# Patient Record
Sex: Male | Born: 1995 | Race: Black or African American | Hispanic: No | Marital: Single | Smoking: Current some day smoker
Health system: Southern US, Community
[De-identification: ages and names within clinical notes are randomized; demographics above are authoritative.]

## PROBLEM LIST (undated history)

## (undated) ENCOUNTER — Ambulatory Visit (HOSPITAL_COMMUNITY): Admission: EM | Payer: Medicaid Other | Source: Home / Self Care

---
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
## 2014-03-12 ENCOUNTER — Emergency Department (HOSPITAL_COMMUNITY): Payer: Medicaid Other

## 2014-03-12 ENCOUNTER — Emergency Department (HOSPITAL_COMMUNITY)
Admission: EM | Admit: 2014-03-12 | Discharge: 2014-03-12 | Disposition: A | Discharge status: Home / Self Care | Payer: Medicaid Other | Attending: Emergency Medicine | Admitting: Emergency Medicine

## 2014-03-12 ENCOUNTER — Encounter (HOSPITAL_COMMUNITY): Payer: Self-pay | Admitting: Emergency Medicine

## 2014-03-12 DIAGNOSIS — Z79899 Other long term (current) drug therapy: Secondary | ICD-10-CM | POA: Diagnosis not present

## 2014-03-12 DIAGNOSIS — S139XXA Sprain of joints and ligaments of unspecified parts of neck, initial encounter: Secondary | ICD-10-CM | POA: Diagnosis not present

## 2014-03-12 DIAGNOSIS — F17200 Nicotine dependence, unspecified, uncomplicated: Secondary | ICD-10-CM | POA: Insufficient documentation

## 2014-03-12 DIAGNOSIS — Y9389 Activity, other specified: Secondary | ICD-10-CM | POA: Diagnosis not present

## 2014-03-12 DIAGNOSIS — Z792 Long term (current) use of antibiotics: Secondary | ICD-10-CM | POA: Insufficient documentation

## 2014-03-12 DIAGNOSIS — S20219A Contusion of unspecified front wall of thorax, initial encounter: Secondary | ICD-10-CM | POA: Diagnosis not present

## 2014-03-12 DIAGNOSIS — S0993XA Unspecified injury of face, initial encounter: Secondary | ICD-10-CM | POA: Diagnosis present

## 2014-03-12 DIAGNOSIS — S199XXA Unspecified injury of neck, initial encounter: Secondary | ICD-10-CM | POA: Diagnosis present

## 2014-03-12 DIAGNOSIS — R079 Chest pain, unspecified: Secondary | ICD-10-CM | POA: Insufficient documentation

## 2014-03-12 DIAGNOSIS — Y92410 Unspecified street and highway as the place of occurrence of the external cause: Secondary | ICD-10-CM | POA: Diagnosis not present

## 2014-03-12 DIAGNOSIS — S161XXA Strain of muscle, fascia and tendon at neck level, initial encounter: Secondary | ICD-10-CM

## 2014-03-12 MED ORDER — IBUPROFEN 600 MG PO TABS: 600.0000 mg | ORAL_TABLET | Freq: Four times a day (QID) | ORAL | Status: DC | PRN

## 2014-03-12 MED ORDER — IOHEXOL 300 MG/ML  SOLN
80.0000 mL | Freq: Once | INTRAMUSCULAR | Status: AC | PRN
  Administered 2014-03-12: 80 mL via INTRAVENOUS

## 2014-03-12 MED ORDER — DIAZEPAM 5 MG PO TABS: 5.0000 mg | ORAL_TABLET | Freq: Four times a day (QID) | ORAL | Status: DC | PRN

## 2014-03-12 MED ORDER — OXYCODONE-ACETAMINOPHEN 5-325 MG PO TABS
1.0000 | ORAL_TABLET | Freq: Once | ORAL | Status: AC
  Administered 2014-03-12: 1 via ORAL
  Filled 2014-03-12: qty 1

## 2014-03-12 MED ORDER — OXYCODONE-ACETAMINOPHEN 5-325 MG PO TABS: 1.0000 | ORAL_TABLET | Freq: Four times a day (QID) | ORAL | Status: DC | PRN
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
## 2014-03-12 NOTE — ED Notes (Signed)
Pt reports "not trying to breath hard because it hurts." Pt current HR 30s-50s. Strip from Diplomatic Services operational officersecretary monitor given to MD.

## 2014-04-14 ENCOUNTER — Ambulatory Visit: Payer: Medicaid Other | Admitting: Family Medicine

## 2014-07-28 ENCOUNTER — Emergency Department (HOSPITAL_COMMUNITY): Payer: Medicaid Other

## 2014-07-28 ENCOUNTER — Emergency Department (HOSPITAL_COMMUNITY)
Admission: EM | Admit: 2014-07-28 | Discharge: 2014-07-28 | Disposition: A | Discharge status: Home / Self Care | Payer: Medicaid Other | Attending: Emergency Medicine | Admitting: Emergency Medicine

## 2014-07-28 ENCOUNTER — Encounter (HOSPITAL_COMMUNITY): Payer: Self-pay | Admitting: Emergency Medicine

## 2014-07-28 DIAGNOSIS — S61509A Unspecified open wound of unspecified wrist, initial encounter: Secondary | ICD-10-CM | POA: Insufficient documentation

## 2014-07-28 DIAGNOSIS — W268XXA Contact with other sharp object(s), not elsewhere classified, initial encounter: Secondary | ICD-10-CM | POA: Insufficient documentation

## 2014-07-28 DIAGNOSIS — Y9389 Activity, other specified: Secondary | ICD-10-CM | POA: Diagnosis not present

## 2014-07-28 DIAGNOSIS — Z79899 Other long term (current) drug therapy: Secondary | ICD-10-CM | POA: Diagnosis not present

## 2014-07-28 DIAGNOSIS — Y9289 Other specified places as the place of occurrence of the external cause: Secondary | ICD-10-CM | POA: Diagnosis not present

## 2014-07-28 DIAGNOSIS — F17200 Nicotine dependence, unspecified, uncomplicated: Secondary | ICD-10-CM | POA: Diagnosis not present

## 2014-07-28 DIAGNOSIS — S66909A Unspecified injury of unspecified muscle, fascia and tendon at wrist and hand level, unspecified hand, initial encounter: Secondary | ICD-10-CM | POA: Diagnosis not present

## 2014-07-28 DIAGNOSIS — S61512A Laceration without foreign body of left wrist, initial encounter: Secondary | ICD-10-CM

## 2014-07-28 MED ORDER — OXYCODONE-ACETAMINOPHEN 5-325 MG PO TABS: 1.0000 | ORAL_TABLET | Freq: Four times a day (QID) | ORAL | Status: DC | PRN

## 2014-07-28 MED ORDER — CEPHALEXIN 500 MG PO CAPS: 500.0000 mg | ORAL_CAPSULE | Freq: Three times a day (TID) | ORAL | Status: DC

## 2014-07-28 MED ORDER — SODIUM BICARBONATE 4 % IV SOLN
5.0000 mL | Freq: Once | INTRAVENOUS | Status: AC
  Administered 2014-07-28: 5 mL via SUBCUTANEOUS
  Filled 2014-07-28: qty 5

## 2014-07-28 MED ORDER — LIDOCAINE HCL 1 % IJ SOLN
30.0000 mL | Freq: Once | INTRAMUSCULAR | Status: AC
  Administered 2014-07-28: 30 mL
  Filled 2014-07-28: qty 40

## 2014-07-28 NOTE — Discharge Instructions (Signed)
Sutured Wound Care °Sutures are stitches that can be used to close wounds. Wound care helps prevent pain and infection.  °HOME CARE INSTRUCTIONS  °· Rest and elevate the injured area until all the pain and swelling are gone. °· Only take over-the-counter or prescription medicines for pain, discomfort, or fever as directed by your caregiver. °· After 48 hours, gently wash the area with mild soap and water once a day, or as directed. Rinse off the soap. Pat the area dry with a clean towel. Do not rub the wound. This may cause bleeding. °· Follow your caregiver's instructions for how often to change the bandage (dressing). Stop using a dressing after 2 days or after the wound stops draining. °· If the dressing sticks, moisten it with soapy water and gently remove it. °· Apply ointment on the wound as directed. °· Avoid stretching a sutured wound. °· Drink enough fluids to keep your urine clear or pale yellow. °· Follow up with your caregiver for suture removal as directed. °· Use sunscreen on your wound for the next 3 to 6 months so the scar will not darken. °SEEK IMMEDIATE MEDICAL CARE IF:  °· Your wound becomes red, swollen, hot, or tender. °· You have increasing pain in the wound. °· You have a red streak that extends from the wound. °· There is pus coming from the wound. °· You have a fever. °· You have shaking chills. °· There is a bad smell coming from the wound. °· You have persistent bleeding from the wound. °MAKE SURE YOU:  °· Understand these instructions. °· Will watch your condition. °· Will get help right away if you are not doing well or get worse. °Document Released: 12/06/2004 Document Revised: 01/21/2012 Document Reviewed: 03/04/2011 °ExitCare® Patient Information ©2015 [employer], [employer]. This information is not intended to replace advice given to you by your health care provider. Make sure you discuss any questions you have with your health care provider. ° °

## 2014-07-28 NOTE — ED Notes (Signed)
Suture cart and supplies to bedside

## 2014-07-28 NOTE — ED Notes (Signed)
Pt to xray

## 2014-07-28 NOTE — ED Provider Notes (Signed)
CSN: [id number]     Arrival date & time 07/28/14  1738 History   First MD Initiated Contact with Patient 07/28/14 1744     Chief Complaint  Patient presents with  . Extremity Laceration     (Consider location/radiation/quality/duration/timing/severity/associated sxs/prior Treatment) The history is provided by the patient.   patient was laceration to left wrist when he put his hand through a glass door, trying to hold it open. No other injury. Patient believes his tetanus is up-to-date. No numbness or weakness. No other medical history  History reviewed. No pertinent past medical history. History reviewed. No pertinent past surgical history. History reviewed. No pertinent family history. History  Substance Use Topics  . Smoking status: Current Some Day Smoker    Types: Cigars  . Smokeless tobacco: Never Used  . Alcohol Use: No    Review of Systems  Musculoskeletal: Negative for back pain.  Skin: Positive for wound.  Neurological: Negative for weakness and numbness.      Allergies  Review of patient's allergies indicates no known allergies.  Home Medications   Prior to Admission medications   Medication Sig Start Date End Date Taking? Authorizing Provider  amphetamine-dextroamphetamine (ADDERALL XR) 20 MG 24 hr capsule Take 1 capsule (20 mg total) by mouth every morning. 02/10/13   Shelly Rubenstein, MD  cephALEXin (KEFLEX) 500 MG capsule Take 1 capsule (500 mg total) by mouth 3 (three) times daily. 07/28/14   Juliet Rude. Golden Emile, MD  diazepam (VALIUM) 5 MG tablet Take 1 tablet (5 mg total) by mouth every 6 (six) hours as needed for muscle spasms. 03/12/14   Juliet Rude. Dyshaun Bonzo, MD  ibuprofen (ADVIL,MOTRIN) 200 MG tablet Take 200 mg by mouth every 6 (six) hours as needed for mild pain.    Historical Provider, MD  ibuprofen (ADVIL,MOTRIN) 600 MG tablet Take 1 tablet (600 mg total) by mouth every 6 (six) hours as needed. 03/12/14   Juliet Rude. Darrin Apodaca, MD  oxyCODONE-acetaminophen  (PERCOCET/ROXICET) 5-325 MG per tablet Take 1-2 tablets by mouth every 6 (six) hours as needed for severe pain. 03/12/14   Juliet Rude. Lizandra Zakrzewski, MD  oxyCODONE-acetaminophen (PERCOCET/ROXICET) 5-325 MG per tablet Take 1-2 tablets by mouth every 6 (six) hours as needed for severe pain. 07/28/14   Juliet Rude. Shuntia Exton, MD   BP 129/78  Pulse 60  Temp(Src) 98 F (36.7 C) (Oral)  Resp 18  SpO2 99% Physical Exam  Constitutional: He appears well-developed and well-nourished.  Cardiovascular: Normal rate.   Musculoskeletal: He exhibits tenderness.  Lacerations to ulnar aspect of left wrist. Flexion and extension intact at the MCP PIP and DIP joints. Flexion appears at wrist, but possibly some weakness on the ulnar aspect. There is visualized tendon that is likely the flexor carpi ulnaris appears to be transected. No pulsatile bleeding. Sensation intact over ulnar distribution of hand. Extension appears intact throughout. Wound is rather jagged with several connecting lacerations, apparently around 9 cm laceration total.    ED Course  LACERATION REPAIR Date/Time: 07/28/2014 6:45 PM Performed by: Benjiman Core R. Authorized by: Billee Cashing Consent: Verbal consent obtained. written consent not obtained. Risks and benefits: risks, benefits and alternatives were discussed Consent given by: patient and parent Patient understanding: patient states understanding of the procedure being performed Imaging studies: imaging studies available Required items: required blood products, implants, devices, and special equipment available Patient identity confirmed: verbally with patient and arm band Time out: Immediately prior to procedure a "time out" was called to verify the  correct patient, procedure, equipment, support staff and site/side marked as required. Body area: upper extremity Location details: left wrist Laceration length: 9 cm Tendon involvement: complex Nerve involvement: none Vascular  damage: no Anesthesia: local infiltration Local anesthetic: lidocaine 1% without epinephrine Anesthetic total: 5 ml Patient sedated: no Preparation: Patient was prepped and draped in the usual sterile fashion. Irrigation method: syringe Amount of cleaning: standard Debridement: none Degree of undermining: none Wound skin closure material used: 5-0 vicryl rapide. Number of sutures: Multiple sutures combination of running horizontal mattress and simple interrupted, Technique: complex Approximation: close Approximation difficulty: complex Dressing: 4x4 sterile gauze Patient tolerance: Patient tolerated the procedure well with no immediate complications.   (including critical care time) Labs Review Labs Reviewed - No data to display  Imaging Review Dg Wrist Complete Left  07/28/2014   CLINICAL DATA:  Laceration at ulnar aspect of the wrist. Patient hand slipped and when through a glass door pane.  EXAM: LEFT WRIST - COMPLETE 3+ VIEW  COMPARISON:  None.  FINDINGS: No acute fracture or malalignment. No retained radiopaque foreign body. Soft tissue irregularity along the ulnar aspect of the wrist consistent with laceration.  IMPRESSION: Soft tissue laceration at the ulnar aspect of the wrist without evidence of underlying osseous involvement or retained radiopaque foreign body.   Electronically Signed   By: Malachy Moan M.D.   On: 07/28/2014 18:10     EKG Interpretation None      MDM   Final diagnoses:  Wrist laceration, left, initial encounter    Patient hand laceration has tendon involvement it is likely flexor carpi ulnaris. Discussed with hand surgery. Skin closed. Will see in the office in followup. Will give antibiotics.    Juliet Rude. Rubin Payor, MD 07/28/14 [phone]

## 2014-07-28 NOTE — ED Notes (Signed)
Pt states that he was trying to catch a glass door and it broke on his wrist. Severe lac noted to ulnar side of wrist. Pt able to move all fingers distal to injury.  Good pulses and cap refill.

## 2014-07-28 NOTE — ED Notes (Signed)
md at bedside  Pt alert and oriented x4. Respirations even and unlabored, bilateral symmetrical rise and fall of chest. Skin warm and dry. In no acute distress. Denies needs.   

## 2014-07-29 ENCOUNTER — Ambulatory Visit: Payer: Medicaid Other | Admitting: Family Medicine

## 2014-07-29 ENCOUNTER — Ambulatory Visit (INDEPENDENT_AMBULATORY_CARE_PROVIDER_SITE_OTHER): Payer: Medicaid Other | Admitting: Family Medicine

## 2014-07-29 VITALS — BP 123/70 | HR 58 | Temp 98.7°F | Wt 153.8 lb

## 2014-07-29 DIAGNOSIS — S61512A Laceration without foreign body of left wrist, initial encounter: Secondary | ICD-10-CM

## 2014-07-29 DIAGNOSIS — S61509A Unspecified open wound of unspecified wrist, initial encounter: Secondary | ICD-10-CM

## 2014-07-29 DIAGNOSIS — S61519A Laceration without foreign body of unspecified wrist, initial encounter: Secondary | ICD-10-CM | POA: Insufficient documentation

## 2014-07-29 NOTE — Assessment & Plan Note (Signed)
Repair looks good. It is intact with hemostasis. No signs of infection. Redressed wound. Applied Ace bandage over dressing to help with wrist stabilization. Patient to followup as directed.

## 2014-07-29 NOTE — Patient Instructions (Signed)
Keep wrist stabilized with Ace bandage. The certainly keep your followup appointment. Keep your dressing clean and dry.

## 2014-07-29 NOTE — Progress Notes (Signed)
   Subjective:    Patient ID: Erik Mason, male    DOB: June 12, 1996, 18 y.o.   MRN: [id number]  HPI Erik Mason is a 18 y.o. male presents to [facility]  Left hand trauma:  Patient was seen in the emergency room last night and had stitches applied to his left wrist. He states he had placed his hand through glass. He did receive his tetanus shot. Comes in to same-day clinic today for increased bleeding concerns. He is accompanied by his mother who states that they applied a dressing today and it is bleeding through a small fraction. His pain is well controlled.   Review of Systems Per history of present illness    Objective:   Physical Exam BP 123/70  Pulse 58  Temp(Src) 98.7 F (37.1 C) (Oral)  Wt 153 lb 12.8 oz (69.763 kg) Gen: Pleasant African American male, no acute distress, nontoxic in appearance. Ext: Left wrist with repair of laceration with what appears to be a Vicryl suture. Repair is intact with hemostasis. No erythema or swelling. No drainage.    Assessment & Plan:

## 2014-08-10 ENCOUNTER — Encounter: Payer: Self-pay | Admitting: Family Medicine

## 2014-08-10 ENCOUNTER — Ambulatory Visit (INDEPENDENT_AMBULATORY_CARE_PROVIDER_SITE_OTHER): Payer: Medicaid Other | Admitting: Family Medicine

## 2014-08-10 VITALS — BP 122/82 | HR 84 | Temp 98.3°F | Wt 152.0 lb

## 2014-08-10 DIAGNOSIS — S61512D Laceration without foreign body of left wrist, subsequent encounter: Secondary | ICD-10-CM

## 2014-08-10 DIAGNOSIS — Z5189 Encounter for other specified aftercare: Secondary | ICD-10-CM

## 2014-08-10 DIAGNOSIS — S61509A Unspecified open wound of unspecified wrist, initial encounter: Secondary | ICD-10-CM

## 2014-08-10 NOTE — Progress Notes (Signed)
   Subjective:    Patient ID: Erik Mason, male    DOB: 18-Nov-1995, 18 y.o.   MRN: [id number]  HPI: Pt presents to clinic for f/u of a hand wound, with stitches placed 12 days ago. He has had no bleeding or discharge and minimal tenderness in the area. He put his hand through glass and was seen in the ED and had stitches placed, then was seen in the office for concerns with bleeding, but Dr. Claiborne BillingsKuneff at that time felt the area looked okay, and recommended follow-up. Since then, he has had no other issues.  Of note, mother is concerned about after-care after stitches are removed, dealing with whether or not the area can get wet, whether he can do lifting, etc.  Review of Systems: As above.     Objective:   Physical Exam BP 122/82  Pulse 84  Temp(Src) 98.3 F (36.8 C) (Oral)  Wt 152 lb (68.947 kg) Gen: well-appearing young adult male in NAD Cardio: RRR, no murmur Pulm: normal WOB Ext: left wrist with stitches in place, no erythema / swelling, no bleeding / drainage MSK/Neuro: left hand with intact neurovascular status  ROM full passively to left hand, though increases pain significantly  Pt willing very slowly to actively completely extend and flex fingers  Strength testing not done due to cooperation, but no apparent deficit left hand vs right     Assessment & Plan:  The above HPI was obtained with the assistance of SUPERVALU INCStanley "Berric" Clifton, [email or web address]. The remainder of this note reflects my independent exam and assessment / plan. 18yo male with left wrist laceration healing well. See problem list note.

## 2014-08-10 NOTE — Patient Instructions (Addendum)
Thank you for coming in, today!  Your wound looks like it's healing well. Wash the area like any other area of skin. DO NOT scrub it. Watch for any brisk bleeding, re-opening of the wound, or redness, swelling, or drainage.  You can use vaseline or an over-the-counter antibiotic to keep the area clean. You can use a plain gauze dressing changed daily if you want while it continues to heal.  If you have any other issues, call or come back to see us as needed.  Please feel free to call with any questions or concerns at any time, at [phone]. --Dr. Derrill CenterStreet  Suture Removal, Care After Refer to this sheet in the next few weeks. These instructions provide you with information on caring for yourself after your procedure. Your health care provider may also give you more specific instructions. Your treatment has been planned according to current medical practices, but problems sometimes occur. Call your health care provider if you have any problems or questions after your procedure. WHAT TO EXPECT AFTER THE PROCEDURE After your stitches (sutures) are removed, it is typical to have the following:  Some discomfort and swelling in the wound area.  Slight redness in the area. HOME CARE INSTRUCTIONS   Change any bandages (dressings) at least once a day or as directed by your health care provider. If the bandage sticks, soak it off with warm, soapy water.  Apply cream or ointment only as directed by your health care provider. If using cream or ointment, wash the area with soap and water 2 times a day to remove all the cream or ointment. Rinse off the soap and pat the area dry with a clean towel.  Keep the wound area dry and clean. If the bandage becomes wet or dirty, or if it develops a bad smell, change it as soon as possible.  Continue to protect the wound from injury.  Use sunscreen when out in the sun. New scars become sunburned easily. SEEK MEDICAL CARE IF:  You have increasing redness,  swelling, or pain in the wound.  You see pus coming from the wound.  You have a fever.  You notice a bad smell coming from the wound or dressing.  Your wound breaks open (edges not staying together). Document Released: 07/24/2001 Document Revised: 08/19/2013 Document Reviewed: 06/10/2013 [facility]Exit[facility] Patient Information 2015 [place]Care, [place]. This information is not intended to replace advice given to you by your health care provider. Make sure you discuss any questions you have with your health care provider.

## 2014-08-10 NOTE — Assessment & Plan Note (Signed)
Left wrist laceration repair with stitches placed 12 days ago, healing well without dehiscence, bleeding, or drainage. Multiple sutures removed today (complex repair including horizontal mattress and simple interrupted), approximately 15 sutures, without difficulty. Wound after-care discussed. F/U PRN.  OF NOTE: On review of ED provider note, there was concern for flexor carpi ulnaris tendon involvement, but pt appears to have full ROM (note he was not completely cooperative with exam, due to discomfort) and no neurovascular compromise. If he continues to have issues, would strongly recommend referral to hand surgery (ED doctor discussed with hand surgeon and recommended hand surgery office f/u, but mother preferred coming here, from discussion). Note routed FYI to PCP.

## 2015-07-01 ENCOUNTER — Emergency Department (INDEPENDENT_AMBULATORY_CARE_PROVIDER_SITE_OTHER)
Admission: EM | Admit: 2015-07-01 | Discharge: 2015-07-01 | Disposition: A | Discharge status: Home / Self Care | Payer: Medicaid Other | Source: Home / Self Care | Attending: Emergency Medicine | Admitting: Emergency Medicine

## 2015-07-01 ENCOUNTER — Encounter (HOSPITAL_COMMUNITY): Payer: Self-pay

## 2015-07-01 DIAGNOSIS — B86 Scabies: Secondary | ICD-10-CM | POA: Diagnosis not present

## 2015-07-01 MED ORDER — PERMETHRIN 5 % EX CREA: TOPICAL_CREAM | CUTANEOUS | Status: DC

## 2015-07-01 MED ORDER — HYDROXYZINE HCL 25 MG PO TABS: 25.0000 mg | ORAL_TABLET | Freq: Four times a day (QID) | ORAL | Status: DC | PRN

## 2015-07-01 NOTE — ED Provider Notes (Signed)
CSN: [id number]     Arrival date & time 07/01/15  1403 History   First MD Initiated Contact with Patient 07/01/15 1432     Chief Complaint  Patient presents with  . Skin Problem   (Consider location/radiation/quality/duration/timing/severity/associated sxs/prior Treatment) HPI  He is a 19 year old man here for evaluation of skin rash. He states for the last 1-2 weeks he has had itchy bumps on his hands. He also states it is in the inner thigh area. It is very itchy. It is worse at night. No fevers. He lives with his mom, she does not have any symptoms. He has not tried anything.  History reviewed. No pertinent past medical history. History reviewed. No pertinent past surgical history. History reviewed. No pertinent family history. Social History  Substance Use Topics  . Smoking status: Current Some Day Smoker    Types: Cigars  . Smokeless tobacco: Never Used  . Alcohol Use: No    Review of Systems As in history of present illness Allergies  Review of patient's allergies indicates no known allergies.  Home Medications   Prior to Admission medications   Medication Sig Start Date End Date Taking? Authorizing Provider  amphetamine-dextroamphetamine (ADDERALL XR) 20 MG 24 hr capsule Take 1 capsule (20 mg total) by mouth every morning. 02/10/13   Shelly Rubenstein, MD  hydrOXYzine (ATARAX/VISTARIL) 25 MG tablet Take 1 tablet (25 mg total) by mouth every 6 (six) hours as needed for itching. 07/01/15   Charm Rings, MD  permethrin (ELIMITE) 5 % cream Apply from neck down; leave in place 8-12 hours then rinse off.  Repeat in 1 week if needed. 07/01/15   Charm Rings, MD   BP 111/75 mmHg  Pulse 81  Temp(Src) 97.9 F (36.6 C) (Oral)  Resp 16  SpO2 95% Physical Exam  Constitutional: He is oriented to person, place, and time. He appears well-developed and well-nourished. No distress.  Cardiovascular: Normal rate.   Pulmonary/Chest: Effort normal.  Neurological: He is alert and oriented to  person, place, and time.  Skin:  Multiple papules in the webspaces of both hands.    ED Course  Procedures (including critical care time) Labs Review Labs Reviewed - No data to display  Imaging Review No results found.   MDM   1. Scabies    Treat with permethrin and hydroxyzine. Handout given.    Charm Rings, MD 07/01/15 [phone]

## 2015-07-01 NOTE — Discharge Instructions (Signed)

## 2015-07-01 NOTE — ED Notes (Signed)
eval by MD only 

## 2015-07-13 ENCOUNTER — Encounter: Payer: Medicaid Other | Admitting: Family Medicine

## 2016-01-16 ENCOUNTER — Other Ambulatory Visit (HOSPITAL_COMMUNITY)
Admission: RE | Admit: 2016-01-16 | Discharge: 2016-01-16 | Disposition: A | Discharge status: Home / Self Care | Payer: Medicaid Other | Source: Ambulatory Visit | Attending: Family Medicine | Admitting: Family Medicine

## 2016-01-16 ENCOUNTER — Encounter (HOSPITAL_COMMUNITY): Payer: Self-pay | Admitting: *Deleted

## 2016-01-16 ENCOUNTER — Emergency Department (INDEPENDENT_AMBULATORY_CARE_PROVIDER_SITE_OTHER)
Admission: EM | Admit: 2016-01-16 | Discharge: 2016-01-16 | Disposition: A | Discharge status: Home / Self Care | Payer: Self-pay | Source: Home / Self Care | Attending: Family Medicine | Admitting: Family Medicine

## 2016-01-16 DIAGNOSIS — Z711 Person with feared health complaint in whom no diagnosis is made: Secondary | ICD-10-CM

## 2016-01-16 DIAGNOSIS — R369 Urethral discharge, unspecified: Secondary | ICD-10-CM

## 2016-01-16 DIAGNOSIS — Z113 Encounter for screening for infections with a predominantly sexual mode of transmission: Secondary | ICD-10-CM | POA: Insufficient documentation

## 2016-01-16 MED ORDER — CEFTRIAXONE SODIUM 1 G IJ SOLR
INTRAMUSCULAR | Status: AC
  Filled 2016-01-16: qty 10

## 2016-01-16 MED ORDER — AZITHROMYCIN 250 MG PO TABS
ORAL_TABLET | ORAL | Status: AC
  Filled 2016-01-16: qty 4

## 2016-01-16 MED ORDER — AZITHROMYCIN 250 MG PO TABS
1000.0000 mg | ORAL_TABLET | Freq: Once | ORAL | Status: AC
  Administered 2016-01-16: 1000 mg via ORAL

## 2016-01-16 MED ORDER — CEFTRIAXONE SODIUM 1 G IJ SOLR
1.0000 g | Freq: Once | INTRAMUSCULAR | Status: AC
  Administered 2016-01-16: 1 g via INTRAMUSCULAR

## 2016-01-16 NOTE — Discharge Instructions (Signed)
Warm wrap to testicle , advil for soreness, see urologist on tues for recheck.

## 2016-01-16 NOTE — ED Provider Notes (Signed)
CSN: [id number]     Arrival date & time 01/16/16  1632 History   First MD Initiated Contact with Patient 01/16/16 1907     Chief Complaint  Patient presents with  . Testicle Pain   (Consider location/radiation/quality/duration/timing/severity/associated sxs/prior Treatment) Patient is a 20 y.o. male presenting with testicular pain. The history is provided by the patient.  Testicle Pain This is a new problem. The current episode started 2 days ago. The problem has been gradually worsening. Associated symptoms comments: Yellow purulent urethral d/c.with tender swelling of right testicle.Marland Kitchen.    History reviewed. No pertinent past medical history. History reviewed. No pertinent past surgical history. History reviewed. No pertinent family history. Social History  Substance Use Topics  . Smoking status: Current Some Day Smoker    Types: Cigars  . Smokeless tobacco: Never Used  . Alcohol Use: No    Review of Systems  Constitutional: Negative.   Gastrointestinal: Negative.   Genitourinary: Positive for discharge, penile swelling, scrotal swelling and testicular pain.  Hematological: Negative for adenopathy.  All other systems reviewed and are negative.   Allergies  Review of patient's allergies indicates no known allergies.  Home Medications   Prior to Admission medications   Medication Sig Start Date End Date Taking? Authorizing Provider  amphetamine-dextroamphetamine (ADDERALL XR) 20 MG 24 hr capsule Take 1 capsule (20 mg total) by mouth every morning. 02/10/13   Shelly RubensteinAngela J Oh Park, MD  hydrOXYzine (ATARAX/VISTARIL) 25 MG tablet Take 1 tablet (25 mg total) by mouth every 6 (six) hours as needed for itching. 07/01/15   Charm RingsErin J Honig, MD  permethrin (ELIMITE) 5 % cream Apply from neck down; leave in place 8-12 hours then rinse off.  Repeat in 1 week if needed. 07/01/15   Charm RingsErin J Honig, MD   Meds Ordered and Administered this Visit   Medications  cefTRIAXone (ROCEPHIN) injection 1 g (1 g  Intramuscular Given 01/16/16 1934)  azithromycin (ZITHROMAX) tablet 1,000 mg (1,000 mg Oral Given 01/16/16 1935)    BP 124/89 mmHg  Pulse 60  Temp(Src) 99.9 F (37.7 C) (Oral)  Resp 18  SpO2 98% No data found.   Physical Exam  Constitutional: He is oriented to person, place, and time. He appears well-developed and well-nourished. No distress.  Abdominal: Soft. Bowel sounds are normal. He exhibits no distension and no mass. There is no tenderness. There is no rebound and no guarding.  Genitourinary:    Right testis shows swelling and tenderness. Circumcised. Discharge found.  Neurological: He is alert and oriented to person, place, and time.  Skin: Skin is warm and dry.  Nursing note and vitals reviewed.   ED Course  Procedures (including critical care time)  Labs Review Labs Reviewed  CYTOLOGY, (ORAL, ANAL, URETHRAL) ANCILLARY ONLY - Abnormal; Notable for the following:    Neisseria gonorrhea **POSITIVE** (*)    All other components within normal limits    Imaging Review No results found.   Visual Acuity Review  Right Eye Distance:   Left Eye Distance:   Bilateral Distance:    Right Eye Near:   Left Eye Near:    Bilateral Near:         MDM   1. Concern about STD in male without diagnosis        Linna HoffJames D Paden Kuras, MD 01/27/16 2124

## 2016-01-16 NOTE — ED Notes (Signed)
Pt  Reports  Swelling  r  Testicle  yest pain  On palpation     Also  Reports   Yellowish  Penile  Discharge    X  1  Week

## 2016-01-17 LAB — CYTOLOGY, (ORAL, ANAL, URETHRAL) ANCILLARY ONLY
Chlamydia: NEGATIVE
Neisseria Gonorrhea: POSITIVE — AB

## 2016-01-18 ENCOUNTER — Encounter: Payer: Self-pay | Admitting: Urology

## 2016-01-18 DIAGNOSIS — A549 Gonococcal infection, unspecified: Secondary | ICD-10-CM | POA: Insufficient documentation

## 2016-01-20 ENCOUNTER — Telehealth (HOSPITAL_COMMUNITY): Payer: Self-pay | Admitting: Emergency Medicine

## 2016-01-20 NOTE — ED Notes (Signed)
Called pt and notified of recent lab results from visit 3/6 Pt ID'd properly... Reports feeling better and sx have subsided  Per Dr. Dayton ScrapeMurray,  Please let patient and health department know that gonorrhea test was positive; this was treated with IM rocephin and po zithromax at the urgent care visit 01/16/16. Chlamydia test was negative. Recheck or followup pcp/Caleb Melancon for persistent symptoms. LM  Adv pt if sx are not getting better to return  Pt verb understanding Education on safe sex given [facility]

## 2016-06-27 ENCOUNTER — Encounter (HOSPITAL_COMMUNITY): Payer: Self-pay | Admitting: Emergency Medicine

## 2016-06-27 ENCOUNTER — Emergency Department (HOSPITAL_COMMUNITY)
Admission: EM | Admit: 2016-06-27 | Discharge: 2016-06-27 | Disposition: A | Discharge status: Home / Self Care | Payer: No Typology Code available for payment source | Attending: Emergency Medicine | Admitting: Emergency Medicine

## 2016-06-27 DIAGNOSIS — F17210 Nicotine dependence, cigarettes, uncomplicated: Secondary | ICD-10-CM | POA: Diagnosis not present

## 2016-06-27 DIAGNOSIS — Y92410 Unspecified street and highway as the place of occurrence of the external cause: Secondary | ICD-10-CM | POA: Diagnosis not present

## 2016-06-27 DIAGNOSIS — F909 Attention-deficit hyperactivity disorder, unspecified type: Secondary | ICD-10-CM | POA: Insufficient documentation

## 2016-06-27 DIAGNOSIS — Y999 Unspecified external cause status: Secondary | ICD-10-CM | POA: Diagnosis not present

## 2016-06-27 DIAGNOSIS — S161XXA Strain of muscle, fascia and tendon at neck level, initial encounter: Secondary | ICD-10-CM | POA: Diagnosis not present

## 2016-06-27 DIAGNOSIS — Y939 Activity, unspecified: Secondary | ICD-10-CM | POA: Diagnosis not present

## 2016-06-27 DIAGNOSIS — S169XXA Unspecified injury of muscle, fascia and tendon at neck level, initial encounter: Secondary | ICD-10-CM | POA: Diagnosis present

## 2016-06-27 MED ORDER — CYCLOBENZAPRINE HCL 10 MG PO TABS
10.0000 mg | ORAL_TABLET | Freq: Once | ORAL | Status: AC
  Administered 2016-06-27: 10 mg via ORAL
  Filled 2016-06-27: qty 1

## 2016-06-27 MED ORDER — IBUPROFEN 800 MG PO TABS
800.0000 mg | ORAL_TABLET | Freq: Once | ORAL | Status: AC
  Administered 2016-06-27: 800 mg via ORAL
  Filled 2016-06-27: qty 1

## 2016-06-27 MED ORDER — CYCLOBENZAPRINE HCL 10 MG PO TABS
10.0000 mg | ORAL_TABLET | Freq: Two times a day (BID) | ORAL | 0 refills | Status: DC | PRN
Start: 2016-06-27 — End: 2020-04-26

## 2016-06-27 MED ORDER — IBUPROFEN 800 MG PO TABS
800.0000 mg | ORAL_TABLET | Freq: Three times a day (TID) | ORAL | 0 refills | Status: DC
Start: 2016-06-27 — End: 2020-04-26
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
## 2017-04-11 ENCOUNTER — Encounter (HOSPITAL_COMMUNITY): Payer: Self-pay | Admitting: *Deleted

## 2017-04-11 ENCOUNTER — Ambulatory Visit (HOSPITAL_COMMUNITY)
Admission: EM | Admit: 2017-04-11 | Discharge: 2017-04-11 | Disposition: A | Discharge status: Home / Self Care | Payer: Medicaid Other | Attending: Emergency Medicine | Admitting: Emergency Medicine

## 2017-04-11 DIAGNOSIS — Z202 Contact with and (suspected) exposure to infections with a predominantly sexual mode of transmission: Secondary | ICD-10-CM | POA: Diagnosis not present

## 2017-04-11 DIAGNOSIS — Z113 Encounter for screening for infections with a predominantly sexual mode of transmission: Secondary | ICD-10-CM | POA: Diagnosis not present

## 2017-04-11 DIAGNOSIS — F17290 Nicotine dependence, other tobacco product, uncomplicated: Secondary | ICD-10-CM | POA: Insufficient documentation

## 2017-04-11 MED ORDER — CEFTRIAXONE SODIUM 250 MG IJ SOLR
250.0000 mg | Freq: Once | INTRAMUSCULAR | Status: AC
  Administered 2017-04-11: 250 mg via INTRAMUSCULAR

## 2017-04-11 MED ORDER — AZITHROMYCIN 250 MG PO TABS
ORAL_TABLET | ORAL | Status: AC
  Filled 2017-04-11: qty 4

## 2017-04-11 MED ORDER — AZITHROMYCIN 250 MG PO TABS
1000.0000 mg | ORAL_TABLET | Freq: Once | ORAL | Status: AC
  Administered 2017-04-11: 1000 mg via ORAL

## 2017-04-11 MED ORDER — LIDOCAINE HCL (PF) 1 % IJ SOLN
INTRAMUSCULAR | Status: AC
  Filled 2017-04-11: qty 2

## 2017-04-11 MED ORDER — CEFTRIAXONE SODIUM 250 MG IJ SOLR
INTRAMUSCULAR | Status: AC
  Filled 2017-04-11: qty 250

## 2017-04-11 NOTE — ED Triage Notes (Signed)
Pt  Reports    He  Wants  To  Be  Checked  For  Std        He  Denies   Any  Symptoms

## 2017-04-11 NOTE — Discharge Instructions (Signed)
Based on your symptoms and findings on physical exam, you are being treated for an infection. You have received an injection of Rocephin, 250 mg and a tablet of Azithromycin, 1 gram. I would recommend following up with your primary care provider, the health department, or return to clinic in 1 week for re-screening to ensure clearance of the infection.  °

## 2017-04-11 NOTE — ED Provider Notes (Signed)
CSN: [id number]     Arrival date & time 04/11/17  1024 History   None    Chief Complaint  Patient presents with  . Exposure to STD   (Consider location/radiation/quality/duration/timing/severity/associated sxs/prior Treatment) 21 year old male presents to clinic with a vague complaint of wanting to "be checked" He denies any complaints, and has no physical symptoms. Upon further questioning he states his girlfriend has "something" and he needs "a pill" He denies any other complaint and review of symptoms is negative.   The history is provided by the patient.  Exposure to STD     History reviewed. No pertinent past medical history. History reviewed. No pertinent surgical history. History reviewed. No pertinent family history. Social History  Substance Use Topics  . Smoking status: Current Some Day Smoker    Types: Cigars  . Smokeless tobacco: Never Used  . Alcohol use No    Review of Systems  Constitutional: Negative.   HENT: Negative.   Respiratory: Negative.   Cardiovascular: Negative.   Gastrointestinal: Negative.   Genitourinary: Negative.   Musculoskeletal: Negative.   Skin: Negative.   Neurological: Negative.     Allergies  Patient has no known allergies.  Home Medications   Prior to Admission medications   Medication Sig Start Date End Date Taking? Authorizing Provider  amphetamine-dextroamphetamine (ADDERALL XR) 20 MG 24 hr capsule Take 1 capsule (20 mg total) by mouth every morning. 02/10/13   Oh Park, Etta QuillAngela J, MD  cyclobenzaprine (FLEXERIL) 10 MG tablet Take 1 tablet (10 mg total) by mouth 2 (two) times daily as needed for muscle spasms. 06/27/16   Danelle Berryapia, Leisa, PA-C  hydrOXYzine (ATARAX/VISTARIL) 25 MG tablet Take 1 tablet (25 mg total) by mouth every 6 (six) hours as needed for itching. 07/01/15   Charm RingsHonig, Erin J, MD  ibuprofen (ADVIL,MOTRIN) 800 MG tablet Take 1 tablet (800 mg total) by mouth 3 (three) times daily. 06/27/16   Danelle Berryapia, Leisa, PA-C  permethrin  (ELIMITE) 5 % cream Apply from neck down; leave in place 8-12 hours then rinse off.  Repeat in 1 week if needed. 07/01/15   Charm RingsHonig, Erin J, MD   Meds Ordered and Administered this Visit   Medications  azithromycin [facility](ZITHROMAX) tablet 1,000 mg (1,000 mg Oral Given 04/11/17 1114)  cefTRIAXone (ROCEPHIN) injection 250 mg (250 mg Intramuscular Given 04/11/17 1115)    BP 120/82 (BP Location: Right Arm)   Pulse 72   Temp 98.6 F (37 C) (Oral)   Resp 18   SpO2 100%  No data found.   Physical Exam  Constitutional: He is oriented to person, place, and time. He appears well-developed and well-nourished. No distress.  HENT:  Head: Normocephalic and atraumatic.  Right Ear: External ear normal.  Left Ear: External ear normal.  Eyes: Conjunctivae are normal.  Cardiovascular: Normal rate and regular rhythm.   Pulmonary/Chest: Effort normal and breath sounds normal.  Abdominal: Soft. Bowel sounds are normal.  Genitourinary:  Genitourinary Comments: Deferred, urine cytology collected  Neurological: He is alert and oriented to person, place, and time.  Skin: Skin is warm and dry. Capillary refill takes less than 2 seconds. He is not diaphoretic.  Psychiatric: He has a normal mood and affect. His behavior is normal.  Nursing note and vitals reviewed.   Urgent Care Course     Procedures (including critical care time)  Labs Review Labs Reviewed  URINE CYTOLOGY ANCILLARY ONLY    Imaging Review No results found.    MDM   1.  STD exposure    Based on physical exam and history, it is likely there may be a STD exposure, urine cytology collected, treating with azithromycin and rocpehin.     Dorena Bodo, NP 04/11/17 [id number]

## 2017-04-12 LAB — URINE CYTOLOGY ANCILLARY ONLY
CHLAMYDIA, DNA PROBE: NEGATIVE
Neisseria Gonorrhea: NEGATIVE
TRICH (WINDOWPATH): NEGATIVE

## 2017-04-17 ENCOUNTER — Emergency Department (HOSPITAL_COMMUNITY)
Admission: EM | Admit: 2017-04-17 | Discharge: 2017-04-18 | Disposition: A | Discharge status: Home / Self Care | Payer: No Typology Code available for payment source | Attending: Emergency Medicine | Admitting: Emergency Medicine

## 2017-04-17 ENCOUNTER — Encounter (HOSPITAL_COMMUNITY): Payer: Self-pay | Admitting: Emergency Medicine

## 2017-04-17 DIAGNOSIS — F17290 Nicotine dependence, other tobacco product, uncomplicated: Secondary | ICD-10-CM | POA: Insufficient documentation

## 2017-04-17 DIAGNOSIS — Y939 Activity, unspecified: Secondary | ICD-10-CM | POA: Diagnosis not present

## 2017-04-17 DIAGNOSIS — M542 Cervicalgia: Secondary | ICD-10-CM | POA: Insufficient documentation

## 2017-04-17 DIAGNOSIS — Y999 Unspecified external cause status: Secondary | ICD-10-CM | POA: Insufficient documentation

## 2017-04-17 DIAGNOSIS — S199XXA Unspecified injury of neck, initial encounter: Secondary | ICD-10-CM | POA: Diagnosis present

## 2017-04-17 DIAGNOSIS — Y92410 Unspecified street and highway as the place of occurrence of the external cause: Secondary | ICD-10-CM | POA: Insufficient documentation

## 2017-04-17 DIAGNOSIS — M546 Pain in thoracic spine: Secondary | ICD-10-CM

## 2017-04-17 DIAGNOSIS — Z79899 Other long term (current) drug therapy: Secondary | ICD-10-CM | POA: Diagnosis not present
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
## 2017-04-18 MED ORDER — NAPROXEN 500 MG PO TABS: 500.0000 mg | ORAL_TABLET | Freq: Two times a day (BID) | ORAL | 0 refills | Status: DC

## 2017-04-18 MED ORDER — ACETAMINOPHEN 325 MG PO TABS
650.0000 mg | ORAL_TABLET | Freq: Once | ORAL | Status: AC
  Administered 2017-04-18: 650 mg via ORAL
  Filled 2017-04-18: qty 2

## 2017-04-18 MED ORDER — METHOCARBAMOL 500 MG PO TABS: 500.0000 mg | ORAL_TABLET | Freq: Two times a day (BID) | ORAL | 0 refills | Status: DC
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
## 2017-04-18 NOTE — Discharge Instructions (Signed)
I have attached back exercises to follow at home. Also given pain medication and muscle relaxer. Please take as written. Your symptoms may worsen before they improve. If you develop new concerning or worsening symptoms please return to the emergency department. Please schedule follow up at wellness center.
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
## 2018-05-01 ENCOUNTER — Ambulatory Visit (HOSPITAL_COMMUNITY)
Admission: EM | Admit: 2018-05-01 | Discharge: 2018-05-01 | Disposition: A | Discharge status: Home / Self Care | Payer: Self-pay | Attending: Family Medicine | Admitting: Family Medicine

## 2018-05-01 ENCOUNTER — Encounter (HOSPITAL_COMMUNITY): Payer: Self-pay | Admitting: Emergency Medicine

## 2018-05-01 DIAGNOSIS — M25532 Pain in left wrist: Secondary | ICD-10-CM

## 2018-05-01 MED ORDER — NAPROXEN 500 MG PO TABS: 500.0000 mg | ORAL_TABLET | Freq: Two times a day (BID) | ORAL | 0 refills | Status: DC

## 2018-05-01 NOTE — Discharge Instructions (Signed)
Ice, elevation, use of ace wrap. Naproxen twice a day, take with food. Don't take with additional ibuprofen.  See exercises, do as able.  If symptoms worsen or do not improve in the next 1-2 weeks to return to be seen or to follow up with PCP or ortho.   To help your symptoms get better, you can:  Rest your muscle and avoid movements or activities that cause pain  Ice the area - You can put a cold gel pack, bag of ice, or bag of frozen vegetables on the painful muscle every 1 to 2 hours, for 15 minutes each time. Put a thin towel between the ice (or other cold object) and your skin. Use the ice (or other cold object) for at least 6 hours after the injury. Some people find it helpful to ice up to 2 days after an injury.  Wrap your muscle with an elastic bandage, other type of wrap, or fabric "sleeve" (picture 1) - This helps support your muscle.  Raise the muscle above the level of your heart (if possible) - For example, you can prop your leg up on pillows. This is helpful only for the first few days after an injury.  Take medicine to reduce the pain and swelling - If you have a lot of pain or a severe muscle strain, your doctor will prescribe a strong pain medicine. If your strain is not severe, you can take an over-the-counter medicine such as acetaminophen (sample brand name: Tylenol), ibuprofen (sample brand names: Advil, Motrin), or naproxen (sample brand name: Aleve).

## 2018-05-01 NOTE — ED Triage Notes (Signed)
Pt states two years ago he injured his L wrist and two weeks ago it started getting sore. Denies recent injury.

## 2018-05-01 NOTE — ED Provider Notes (Signed)
[facility]    CSN: [id number] Arrival date & time: 05/01/18  1420     History   Chief Complaint Chief Complaint  Patient presents with  . Wrist Pain    HPI Erik Mason is a 22 y.o. male.   Erik Mason presents with complaints of left wrist pain which has been ongoing for the past 2 weeks. No specific known injury. Works with his hands a lot as we works at the [employer]. States he had a bad cut to the wrist from glass and required a lot of suturing, approximately 2-3 years ago. No previous break. Pain is throbbing. He is right handed. His pinky has some tingling. Took ibuprofen yesterday which minimally helped. Pain is moderate in severity; worse with movement. Without contributing medical history.      ROS per HPI.      History reviewed. No pertinent past medical history.  Patient Active Problem List   Diagnosis Date Noted  . Gonorrhea in male 01/18/2016  . Laceration of wrist with complication 07/29/2014  . Preventative health care 12/22/2012  . ADHD (attention deficit hyperactivity disorder) 05/05/2012  . 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 No pertinent surgical history.     Home Medications    Prior to Admission medications   Medication Sig Start Date End Date Taking? Authorizing Provider  amphetamine-dextroamphetamine (ADDERALL XR) 20 MG 24 hr capsule Take 1 capsule (20 mg total) by mouth every morning. Patient not taking: Reported on 05/01/2018 02/10/13   Madolyn Friezeh Park, Etta QuillAngela J, MD  cyclobenzaprine (FLEXERIL) 10 MG tablet Take 1 tablet (10 mg total) by mouth 2 (two) times daily as needed for muscle spasms. Patient not taking: Reported on 05/01/2018 06/27/16   Danelle Berryapia, Leisa, PA-C  hydrOXYzine (ATARAX/VISTARIL) 25 MG tablet Take 1 tablet (25 mg total) by mouth every 6 (six) hours as needed for itching. Patient not taking: Reported on 05/01/2018 07/01/15   Charm RingsHonig, Erin J, MD  ibuprofen (ADVIL,MOTRIN) 800 MG tablet Take 1 tablet (800 mg total) by  mouth 3 (three) times daily. 06/27/16   Danelle Berryapia, Leisa, PA-C  methocarbamol (ROBAXIN) 500 MG tablet Take 1 tablet (500 mg total) by mouth 2 (two) times daily. Patient not taking: Reported on 05/01/2018 04/18/17   Maczis, Elmer SowMichael M, PA-C  naproxen (NAPROSYN) 500 MG tablet Take 1 tablet (500 mg total) by mouth 2 (two) times daily. 05/01/18   Georgetta HaberBurky, Tziporah Knoke B, NP  permethrin (ELIMITE) 5 % cream Apply from neck down; leave in place 8-12 hours then rinse off.  Repeat in 1 week if needed. 07/01/15   Charm RingsHonig, Erin J, MD    Family History Family History  Problem Relation Age of Onset  . Healthy Mother   . Healthy Father     Social History Social History   Tobacco Use  . Smoking status: Current Some Day Smoker    Types: Cigars  . Smokeless tobacco: Never Used  Substance Use Topics  . Alcohol use: No  . Drug use: No     Allergies   Patient has no known allergies.   Review of Systems Review of Systems   Physical Exam Triage Vital Signs ED Triage Vitals [05/01/18 1428]  Enc Vitals Group     BP 120/78     Pulse Rate (!) 50     Resp 16     Temp 98 F (36.7 C)     Temp src      SpO2 100 %     Weight  Height      Head Circumference      Peak Flow      Pain Score      Pain Loc      Pain Edu?      Excl. in GC?    No data found.  Updated Vital Signs BP 120/78   Pulse (!) 50   Temp 98 F (36.7 C)   Resp 16   SpO2 100%    Physical Exam  Constitutional: He is oriented to person, place, and time. He appears well-developed and well-nourished.  Cardiovascular: Regular rhythm. Bradycardia present.  Pulmonary/Chest: Effort normal and breath sounds normal.  Musculoskeletal:       Left wrist: He exhibits decreased range of motion and tenderness. He exhibits no bony tenderness, no swelling, no effusion, no crepitus, no deformity and no laceration.       Left hand: Normal.  Without specific bony tenderness to wrist; pain primarily at palmar side of wrist to soft tissues; passive  ROM normal; pain with with active flexion and minimal pain with extension; sensation intact; fingers and hand non tender, some pain to wrist with full active ROM of fingers; strong radial pulse; no snuff box tenderness or thumb pain   Neurological: He is alert and oriented to person, place, and time.  Skin: Skin is warm and dry.     UC Treatments / Results  Labs (all labs ordered are listed, but only abnormal results are displayed) Labs Reviewed - No data to display  EKG None  Radiology No results found.  Procedures Procedures (including critical care time)  Medications Ordered in UC Medications - No data to display  Initial Impression / Assessment and Plan / UC Course  I have reviewed the triage vital signs and the nursing notes.  Pertinent labs & imaging results that were available during my care of the patient were reviewed by me and considered in my medical decision making (see chart for details).     Somewhat non specific wrist exam. No injury or trauma. Suspect tendonitis related to work. Nsaids recommended. ACE wrap applied for comfort and compression. Return precautions provided. Patient verbalized understanding and agreeable to plan.    Final Clinical Impressions(s) / UC Diagnoses   Final diagnoses:  Left wrist pain     Discharge Instructions     Ice, elevation, use of ace wrap. Naproxen twice a day, take with food. Don't take with additional ibuprofen.  See exercises, do as able.  If symptoms worsen or do not improve in the next 1-2 weeks to return to be seen or to follow up with PCP or ortho.   To help your symptoms get better, you can:  Rest your muscle and avoid movements or activities that cause pain  Ice the area - You can put a cold gel pack, bag of ice, or bag of frozen vegetables on the painful muscle every 1 to 2 hours, for 15 minutes each time. Put a thin towel between the ice (or other cold object) and your skin. Use the ice (or other cold  object) for at least 6 hours after the injury. Some people find it helpful to ice up to 2 days after an injury.  Wrap your muscle with an elastic bandage, other type of wrap, or fabric "sleeve" (picture 1) - This helps support your muscle.  Raise the muscle above the level of your heart (if possible) - For example, you can prop your leg up on pillows. This is helpful  only for the first few days after an injury.  Take medicine to reduce the pain and swelling - If you have a lot of pain or a severe muscle strain, your doctor will prescribe a strong pain medicine. If your strain is not severe, you can take an over-the-counter medicine such as acetaminophen (sample brand name: Tylenol), ibuprofen (sample brand names: Advil, Motrin), or naproxen (sample brand name: Aleve).      ED Prescriptions    Medication Sig Dispense Auth. Provider   naproxen (NAPROSYN) 500 MG tablet Take 1 tablet (500 mg total) by mouth 2 (two) times daily. 30 tablet Georgetta Haber, NP     Controlled Substance Prescriptions [place] Controlled Substance Registry consulted? Not Applicable   Georgetta Haber, NP 05/01/18 1504

## 2018-05-05 ENCOUNTER — Telehealth (HOSPITAL_COMMUNITY): Payer: Self-pay

## 2018-05-12 ENCOUNTER — Telehealth (HOSPITAL_COMMUNITY): Payer: Self-pay

## 2019-06-27 ENCOUNTER — Ambulatory Visit (HOSPITAL_COMMUNITY)
Admission: EM | Admit: 2019-06-27 | Discharge: 2019-06-27 | Disposition: A | Discharge status: Home / Self Care | Payer: Medicaid Other | Attending: Family Medicine | Admitting: Family Medicine

## 2019-06-27 ENCOUNTER — Encounter (HOSPITAL_COMMUNITY): Payer: Self-pay

## 2019-06-27 ENCOUNTER — Other Ambulatory Visit: Payer: Self-pay

## 2019-06-27 DIAGNOSIS — Z113 Encounter for screening for infections with a predominantly sexual mode of transmission: Secondary | ICD-10-CM | POA: Insufficient documentation

## 2019-06-27 NOTE — ED Provider Notes (Signed)
[facility]   [id number] 06/27/19 Arrival Time: 1258  ASSESSMENT & PLAN:  1. Screen for STD (sexually transmitted disease)     Declines HIV/RPR testing.   Discharge Instructions     We have sent testing for sexually transmitted infections. We will notify you of any positive results once they are received. If required, we will prescribe any medications you might need.  Please refrain from all sexual activity for at least the next seven days.     Pending: Labs Reviewed  URINE CYTOLOGY ANCILLARY ONLY    Will notify of any positive results. Instructed to refrain from sexual activity for at least seven days.  Reviewed expectations re: course of current medical issues. Questions answered. Outlined signs and symptoms indicating need for more acute intervention. Patient verbalized understanding. After Visit Summary given.   SUBJECTIVE:  Erik Mason is a 23 y.o. male who requests STD screening. Mainly concerned re: gonorrhea/chlamydia. No symptoms. Afebrile. No abdominal or pelvic pain. No n/v. No rashes or lesions. Sexually active with single male partner.  ROS: As per HPI.  OBJECTIVE:  Vitals:   06/27/19 1348  BP: 135/86  Pulse: 65  Resp: 16  Temp: 98.7 F (37.1 C)  TempSrc: Oral  SpO2: 100%     General appearance: alert, cooperative, appears stated age and no distress Throat: lips, mucosa, and tongue normal; teeth and gums normal CV: RRR Lungs: CTAB Back: no CVA tenderness; FROM at waist Abdomen: soft, non-tender GU: deferred Skin: warm and dry Psychological: alert and cooperative; normal mood and affect.    Labs Reviewed  URINE CYTOLOGY ANCILLARY ONLY    No Known Allergies   Family History  Problem Relation Age of Onset  . Healthy Mother   . Healthy Father    Social History   Socioeconomic History  . Marital status: Single    Spouse name: Not on file  . Number of children: Not on file  . Years of education: Not on file  .  Highest education level: Not on file  Occupational History  . Not on file  Social Needs  . Financial resource strain: Not on file  . Food insecurity    Worry: Not on file    Inability: Not on file  . Transportation needs    Medical: Not on file    Non-medical: Not on file  Tobacco Use  . Smoking status: Current Some Day Smoker    Types: Cigars  . Smokeless tobacco: Never Used  Substance and Sexual Activity  . Alcohol use: No  . Drug use: No  . Sexual activity: Not on file  Lifestyle  . Physical activity    Days per week: Not on file    Minutes per session: Not on file  . Stress: Not on file  Relationships  . Social Musicianconnections    Talks on phone: Not on file    Gets together: Not on file    Attends religious service: Not on file    Active member of club or organization: Not on file    Attends meetings of clubs or organizations: Not on file    Relationship status: Not on file  . Intimate partner violence    Fear of current or ex partner: Not on file    Emotionally abused: Not on file    Physically abused: Not on file    Forced sexual activity: Not on file  Other Topics Concern  . Not on file  Social History Narrative  . Not  on file          Vanessa Kick, MD 06/27/19 1413

## 2019-06-27 NOTE — Discharge Instructions (Addendum)
We have sent testing for sexually transmitted infections. We will notify you of any positive results once they are received. If required, we will prescribe any medications you might need.  Please refrain from all sexual activity for at least the next seven days.  

## 2019-06-27 NOTE — ED Triage Notes (Signed)
Pt would like to get a check up on STD, pt states he has not been exposed but would like to receive a check up

## 2019-06-30 LAB — URINE CYTOLOGY ANCILLARY ONLY
Chlamydia: POSITIVE — AB
Neisseria Gonorrhea: NEGATIVE
Trichomonas: NEGATIVE

## 2019-07-01 ENCOUNTER — Telehealth (HOSPITAL_COMMUNITY): Payer: Self-pay | Admitting: Emergency Medicine

## 2019-07-01 MED ORDER — AZITHROMYCIN 250 MG PO TABS: 1000.0000 mg | ORAL_TABLET | Freq: Once | ORAL | 0 refills | Status: AC

## 2019-07-01 NOTE — Telephone Encounter (Signed)
Chlamydia is positive.  Rx po zithromax 1g #1 dose no refills was sent to the pharmacy of record.  Pt needs education to please refrain from sexual intercourse for 7 days to give the medicine time to work, sexual partners need to be notified and tested/treated.  Condoms may reduce risk of reinfection.  Recheck or followup with PCP for further evaluation if symptoms are not improving.   [facility] notified.  Patient contacted and made aware of    results, all questions answered  Pt wants to pick up physical copy of prescription and take to pharmacy. Will return in the morning to pick up script. Will leave prescription on my office desk.

## 2019-07-02 ENCOUNTER — Telehealth (HOSPITAL_COMMUNITY): Payer: Self-pay | Admitting: Emergency Medicine

## 2019-07-02 NOTE — Telephone Encounter (Signed)
Called and spoke to patient again on the phone to remind him to come by and grab prescription. Pt will return today to pick it up.

## 2020-04-25 ENCOUNTER — Encounter (HOSPITAL_COMMUNITY): Payer: Self-pay | Admitting: Emergency Medicine

## 2020-04-25 ENCOUNTER — Emergency Department (HOSPITAL_COMMUNITY)
Admission: EM | Admit: 2020-04-25 | Discharge: 2020-04-25 | Disposition: A | Discharge status: Home / Self Care | Payer: Medicaid Other | Attending: Emergency Medicine | Admitting: Emergency Medicine

## 2020-04-25 DIAGNOSIS — S01111A Laceration without foreign body of right eyelid and periocular area, initial encounter: Secondary | ICD-10-CM | POA: Insufficient documentation

## 2020-04-25 DIAGNOSIS — Y92310 Basketball court as the place of occurrence of the external cause: Secondary | ICD-10-CM | POA: Insufficient documentation

## 2020-04-25 DIAGNOSIS — W51XXXA Accidental striking against or bumped into by another person, initial encounter: Secondary | ICD-10-CM | POA: Insufficient documentation

## 2020-04-25 DIAGNOSIS — Z5321 Procedure and treatment not carried out due to patient leaving prior to being seen by health care provider: Secondary | ICD-10-CM | POA: Insufficient documentation

## 2020-04-25 DIAGNOSIS — Y9367 Activity, basketball: Secondary | ICD-10-CM | POA: Insufficient documentation

## 2020-04-25 DIAGNOSIS — Y999 Unspecified external cause status: Secondary | ICD-10-CM | POA: Insufficient documentation

## 2020-04-25 NOTE — ED Notes (Signed)
Patient stated they no longer wanted to wait to be seen tonight.

## 2020-04-25 NOTE — ED Triage Notes (Signed)
Pt reports getting elbowed accidentally while playing basketball yesterday around 1600, laceration over R eye. Bleeding controlled, last tetanus unknown.

## 2020-04-26 ENCOUNTER — Ambulatory Visit (HOSPITAL_COMMUNITY)
Admission: EM | Admit: 2020-04-26 | Discharge: 2020-04-26 | Disposition: A | Discharge status: Home / Self Care | Payer: Medicaid Other

## 2020-04-26 ENCOUNTER — Other Ambulatory Visit: Payer: Self-pay

## 2020-04-26 DIAGNOSIS — S0011XA Contusion of right eyelid and periocular area, initial encounter: Secondary | ICD-10-CM

## 2020-04-26 DIAGNOSIS — S01111A Laceration without foreign body of right eyelid and periocular area, initial encounter: Secondary | ICD-10-CM

## 2020-04-26 DIAGNOSIS — R519 Headache, unspecified: Secondary | ICD-10-CM

## 2020-04-26 NOTE — ED Provider Notes (Signed)
  [facility]   MRN: [id number] DOB: 08-Dec-1995  Subjective:   Erik Mason is a 24 y.o. male presenting for suffering a right eyebrow laceration 2 days ago. Patient accidentally got an elbow to the eye while playing basketball. Cleaned his wound with peroxide and has been using bad aids.  Tdap updated about 2 weeks ago per patient.  Denies taking chronic medications.    No Known Allergies  PMH of ADHD, acne.   No past surgical history on file.  Family History  Problem Relation Age of Onset  . Healthy Mother   . Healthy Father     Social History   Tobacco Use  . Smoking status: Current Some Day Smoker    Types: Cigars  . Smokeless tobacco: Never Used  Substance Use Topics  . Alcohol use: No  . Drug use: No    ROS   Objective:   Vitals: BP 114/66   Pulse 73   Temp (!) 97 F (36.1 C)   Resp 16   SpO2 100%   Physical Exam Constitutional:      General: He is not in acute distress.    Appearance: Normal appearance. He is well-developed and normal weight. He is not ill-appearing, toxic-appearing or diaphoretic.  HENT:     Head: Normocephalic and atraumatic.     Right Ear: External ear normal.     Left Ear: External ear normal.     Nose: Nose normal.     Mouth/Throat:     Pharynx: Oropharynx is clear.  Eyes:     General: No scleral icterus.       Right eye: No discharge.        Left eye: No discharge.     Extraocular Movements: Extraocular movements intact.     Pupils: Pupils are equal, round, and reactive to light.   Cardiovascular:     Rate and Rhythm: Normal rate.  Pulmonary:     Effort: Pulmonary effort is normal.  Musculoskeletal:     Cervical back: Normal range of motion.  Neurological:     Mental Status: He is alert and oriented to person, place, and time.  Psychiatric:        Mood and Affect: Mood normal.        Behavior: Behavior normal.        Thought Content: Thought content normal.        Judgment: Judgment normal.    3  half-inch Steri-Strips applied to the wound crossing the eyebrow.  Assessment and Plan :   PDMP not reviewed this encounter.  1. Facial pain   2. Eyebrow laceration, right, initial encounter   3. Eyebrow contusion, right, initial encounter     Wound care reviewed. Use general supportive care. Counseled patient on potential for adverse effects with medications prescribed/recommended today, ER and return-to-clinic precautions discussed, patient verbalized understanding.    Wallis Bamberg, PA-C 04/26/20 1455

## 2020-04-26 NOTE — ED Triage Notes (Signed)
Pt c/o laceration above right eye from injury playing basketball 2 days ago. Last tetanus unknown.

## 2020-04-26 NOTE — Discharge Instructions (Addendum)
Change the steri-strips every 1-2 days. If you develop redness, hot sensation, drainage of pus, eye swelling, eye pain, then return to our clinic for a recheck as these are a sign of infection. I expect your wound will heal over the next 5-6 days.  Do not apply any ointments or creams. Keep your wound dry. Clean it gently with warm soapy water.

## 2020-09-09 ENCOUNTER — Emergency Department (HOSPITAL_COMMUNITY)
Admission: EM | Admit: 2020-09-09 | Discharge: 2020-09-10 | Disposition: A | Discharge status: Home / Self Care | Payer: Medicaid Other | Attending: Emergency Medicine | Admitting: Emergency Medicine

## 2020-09-09 ENCOUNTER — Encounter (HOSPITAL_COMMUNITY): Payer: Self-pay

## 2020-09-09 ENCOUNTER — Other Ambulatory Visit: Payer: Self-pay

## 2020-09-09 DIAGNOSIS — Y92410 Unspecified street and highway as the place of occurrence of the external cause: Secondary | ICD-10-CM | POA: Diagnosis not present

## 2020-09-09 DIAGNOSIS — S161XXA Strain of muscle, fascia and tendon at neck level, initial encounter: Secondary | ICD-10-CM | POA: Diagnosis not present

## 2020-09-09 DIAGNOSIS — Y9389 Activity, other specified: Secondary | ICD-10-CM | POA: Diagnosis not present

## 2020-09-09 DIAGNOSIS — F17290 Nicotine dependence, other tobacco product, uncomplicated: Secondary | ICD-10-CM | POA: Insufficient documentation

## 2020-09-09 DIAGNOSIS — S299XXA Unspecified injury of thorax, initial encounter: Secondary | ICD-10-CM | POA: Diagnosis present
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
## 2020-09-10 MED ORDER — IBUPROFEN 800 MG PO TABS
800.0000 mg | ORAL_TABLET | Freq: Once | ORAL | Status: AC
  Administered 2020-09-10: 800 mg via ORAL
  Filled 2020-09-10: qty 1

## 2020-09-10 MED ORDER — ACETAMINOPHEN 500 MG PO TABS
1000.0000 mg | ORAL_TABLET | Freq: Once | ORAL | Status: AC
  Administered 2020-09-10: 1000 mg via ORAL
  Filled 2020-09-10: qty 2

## 2020-09-10 MED ORDER — IBUPROFEN 800 MG PO TABS: 800.0000 mg | ORAL_TABLET | Freq: Four times a day (QID) | ORAL | 0 refills | Status: DC | PRN

## 2020-09-10 MED ORDER — CYCLOBENZAPRINE HCL 10 MG PO TABS: 10.0000 mg | ORAL_TABLET | Freq: Two times a day (BID) | ORAL | 0 refills | Status: DC | PRN

## 2021-09-05 ENCOUNTER — Encounter (HOSPITAL_COMMUNITY): Payer: Self-pay

## 2021-09-05 ENCOUNTER — Other Ambulatory Visit: Payer: Self-pay

## 2021-09-05 ENCOUNTER — Ambulatory Visit (HOSPITAL_COMMUNITY)
Admission: RE | Admit: 2021-09-05 | Discharge: 2021-09-05 | Disposition: A | Discharge status: Home / Self Care | Payer: Self-pay | Source: Ambulatory Visit | Attending: Emergency Medicine | Admitting: Emergency Medicine

## 2021-09-05 VITALS — BP 120/76 | HR 60 | Temp 97.9°F | Resp 17

## 2021-09-05 DIAGNOSIS — R519 Headache, unspecified: Secondary | ICD-10-CM

## 2021-09-05 MED ORDER — KETOROLAC TROMETHAMINE 30 MG/ML IJ SOLN
30.0000 mg | Freq: Once | INTRAMUSCULAR | Status: AC
  Administered 2021-09-05: 30 mg via INTRAMUSCULAR

## 2021-09-05 MED ORDER — KETOROLAC TROMETHAMINE 30 MG/ML IJ SOLN
INTRAMUSCULAR | Status: AC
  Filled 2021-09-05: qty 1

## 2021-09-05 MED ORDER — DEXAMETHASONE SODIUM PHOSPHATE 10 MG/ML IJ SOLN
10.0000 mg | Freq: Once | INTRAMUSCULAR | Status: AC
  Administered 2021-09-05: 10 mg via INTRAMUSCULAR

## 2021-09-05 MED ORDER — SUMATRIPTAN SUCCINATE 6 MG/0.5ML ~~LOC~~ SOLN
6.0000 mg | Freq: Once | SUBCUTANEOUS | Status: AC
  Administered 2021-09-05: 6 mg via SUBCUTANEOUS

## 2021-09-05 MED ORDER — DEXAMETHASONE SODIUM PHOSPHATE 10 MG/ML IJ SOLN
INTRAMUSCULAR | Status: AC
  Filled 2021-09-05: qty 1

## 2021-09-05 MED ORDER — SUMATRIPTAN SUCCINATE 6 MG/0.5ML ~~LOC~~ SOLN
SUBCUTANEOUS | Status: AC
  Filled 2021-09-05: qty 0.5

## 2021-09-05 MED ORDER — DEXAMETHASONE SODIUM PHOSPHATE 10 MG/ML IJ SOLN: 10.0000 mg | Freq: Once | INTRAMUSCULAR | Status: DC

## 2021-09-05 NOTE — ED Triage Notes (Signed)
Pt presents with a headache x 2 weeks. States he has take Tylenol and Ibuprofen.

## 2021-09-05 NOTE — Discharge Instructions (Signed)
You are given 3 injections today to help reduce inflammation and reduce your headache pain  Once home you may begin taking ibuprofen 600 mg every 6 hours and or Tylenol 650 mg every 6 hours to help with your headaches  Any point if your headache becomes the most severe pain you have ever felt please go to the nearest emergency department for evaluation  A primary care referral has been placed for you there is also information in your paperwork on how to set up appointment online for a primary care doctor so that you may receive long-term management for reoccurring headaches

## 2021-09-05 NOTE — ED Provider Notes (Signed)
[facility]    CSN: [id number] Arrival date & time: 09/05/21  1155      History   Chief Complaint Chief Complaint  Patient presents with   Headache    Appt    HPI Erik Mason is a 25 y.o. male.   Patient presents with intermittent generalized throbbing headaches for 1 to 2 weeks.  Endorses photophobia and accompanying nausea at times.  Denies fever chills, body aches, blurred vision, floaters, ear pain, cough, sore throat, dizziness, lightheadedness, weakness, changes in speech..  Tolerating food and liquids.  Has attempted use of over-the-counter ibuprofen with no relief.  No pertinent medical history.  History reviewed. No pertinent past medical history.  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 No pertinent surgical history.     Home Medications    Prior to Admission medications   Medication Sig Start Date End Date Taking? Authorizing Provider  cyclobenzaprine (FLEXERIL) 10 MG tablet Take 1 tablet (10 mg total) by mouth 2 (two) times daily as needed for muscle spasms. 09/10/20   Gilda Crease, MD  ibuprofen (ADVIL) 800 MG tablet Take 1 tablet (800 mg total) by mouth every 6 (six) hours as needed for moderate pain. 09/10/20   Gilda Crease, MD    Family History Family History  Problem Relation Age of Onset   Healthy Mother    Healthy Father     Social History Social History   Tobacco Use   Smoking status: Some Days    Types: Cigars   Smokeless tobacco: Never  Substance Use Topics   Alcohol use: No   Drug use: No     Allergies   Patient has no known allergies.   Review of Systems Review of Systems  Constitutional: Negative.   HENT: Negative.    Eyes:  Positive for  photophobia. Negative for pain, discharge, redness, itching and visual disturbance.  Respiratory: Negative.    Cardiovascular: Negative.   Neurological:  Positive for headaches. Negative for dizziness, tremors, seizures, syncope, facial asymmetry, speech difficulty, weakness, light-headedness and numbness.    Physical Exam Triage Vital Signs ED Triage Vitals  Enc Vitals Group     BP 09/05/21 1227 120/76     Pulse Rate 09/05/21 1227 60     Resp 09/05/21 1227 17     Temp 09/05/21 1227 97.9 F (36.6 C)     Temp Source 09/05/21 1227 Oral     SpO2 09/05/21 1227 100 %     Weight --      Height --      Head Circumference --      Peak Flow --      Pain Score 09/05/21 1225 10     Pain Loc --      Pain Edu? --      Excl. in GC? --    No data found.  Updated Vital Signs BP 120/76 (BP Location: Left Arm)   Pulse 60   Temp 97.9 F (36.6 C) (Oral)   Resp 17   SpO2 100%   Visual Acuity Right Eye Distance:   Left Eye Distance:   Bilateral Distance:    Right Eye Near:   Left Eye Near:    Bilateral Near:     Physical  Exam Constitutional:      Appearance: Normal appearance. He is normal weight.  HENT:     Head: Normocephalic.     Right Ear: Tympanic membrane, ear canal and external ear normal.     Left Ear: Tympanic membrane, ear canal and external ear normal.  Eyes:     Extraocular Movements: Extraocular movements intact.  Musculoskeletal:        General: Normal range of motion.  Skin:    General: Skin is warm and dry.  Neurological:     General: No focal deficit present.     Mental Status: He is alert and oriented to person, place, and time. Mental status is at baseline.     Cranial Nerves: No cranial nerve deficit.     Motor: No weakness.     Gait: Gait normal.  Psychiatric:        Mood and Affect: Mood normal.        Behavior: Behavior normal.     UC Treatments / Results  Labs (all labs ordered are listed, but only abnormal results are displayed) Labs  Reviewed - No data to display  EKG   Radiology No results found.  Procedures Procedures (including critical care time)  Medications Ordered in UC Medications  ketorolac (TORADOL) 30 MG/ML injection 30 mg (has no administration in time range)  dexamethasone (DECADRON) injection 10 mg (has no administration in time range)  SUMAtriptan (IMITREX) injection 6 mg (has no administration in time range)    Initial Impression / Assessment and Plan / UC Course  I have reviewed the triage vital signs and the nursing notes.  Pertinent labs & imaging results that were available during my care of the patient were reviewed by me and considered in my medical decision making (see chart for details).  Bad headache  Vital signs stable, no abnormalities on neurological exam, will treat conservatively today  1.  Toradol 30 mg IM now, Decadron 10 mg IM now, Imitrex 6 mg subcutaneous now 2.  Recommended continued use over-the-counter ibuprofen and Tylenol for management 3.  Given strict precautions to go to nearest emergency department for severe headache for persistent headache PCP referral has been placed Final Clinical Impressions(s) / UC Diagnoses   Final diagnoses:  Bad headache     Discharge Instructions      You are given 3 injections today to help reduce inflammation and reduce your headache pain  Once home you may begin taking ibuprofen 600 mg every 6 hours and or Tylenol 650 mg every 6 hours to help with your headaches  Any point if your headache becomes the most severe pain you have ever felt please go to the nearest emergency department for evaluation  A primary care referral has been placed for you there is also information in your paperwork on how to set up appointment online for a primary care doctor so that you may receive long-term management for reoccurring headaches   ED Prescriptions   None    PDMP not reviewed this encounter.   Valinda Hoar, NP 09/05/21  1253

## 2022-04-19 ENCOUNTER — Ambulatory Visit (HOSPITAL_COMMUNITY)
Admission: EM | Admit: 2022-04-19 | Discharge: 2022-04-19 | Disposition: A | Discharge status: Home / Self Care | Payer: Self-pay | Attending: Student | Admitting: Student

## 2022-04-19 ENCOUNTER — Ambulatory Visit (INDEPENDENT_AMBULATORY_CARE_PROVIDER_SITE_OTHER): Payer: Medicaid Other

## 2022-04-19 DIAGNOSIS — M25572 Pain in left ankle and joints of left foot: Secondary | ICD-10-CM

## 2022-04-19 DIAGNOSIS — S93492A Sprain of other ligament of left ankle, initial encounter: Secondary | ICD-10-CM

## 2022-04-19 NOTE — Discharge Instructions (Addendum)
-  Ankle brace while pain persists  -You can take Tylenol up to 1000 mg 3 times daily, and ibuprofen up to 600 mg 3 times daily with food.  You can take these together, or alternate every 3-4 hours. -Rest, ice, elevation

## 2022-04-19 NOTE — ED Provider Notes (Signed)
[facility]    CSN: [id number] Arrival date & [id number]: 04/19/22  0854      History   Chief Complaint Chief Complaint  Patient presents with   Ankle Pain    HPI Erik Mason is a 26 y.o. male presenting with left ankle pain for 1 week.  Distant history of left ankle fracture greater than 10 years ago per patient.  He denies recent trauma or overuse, though he does stand a lot at work.  He describes pain over the lateral and anterior ankle, worse with ambulation and movement.  Denies sensation changes.  HPI  No past medical history on file.  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     Home Medications    Prior to Admission medications   Medication Sig Start Date End Date Taking? Authorizing Provider  ibuprofen (ADVIL) 800 MG tablet Take 1 tablet (800 mg total) by mouth every 6 (six) hours as needed for moderate pain. 09/10/20   Gilda CreasePollina, Christopher J, MD    Family History Family History  Problem Relation Age of Onset   Healthy Mother    Healthy Father     Social History Social History   Tobacco Use   Smoking status: Some Days    Types: Cigars   Smokeless tobacco: Never  Substance Use Topics   Alcohol use: No   Drug use: No     Allergies   Patient has no known allergies.   Review of Systems Review of Systems  Musculoskeletal:        L ankle pain   All other systems reviewed and are negative.    Physical Exam Triage Vital Signs ED Triage Vitals  Enc Vitals Group     BP 04/19/22 0912 120/78     Pulse Rate 04/19/22 0912 69     Resp 04/19/22 0912 16     Temp 04/19/22 0912 98.4 F (36.9 C)     Temp src --      SpO2 04/19/22 0912 97 %     Weight --      Height --      Head  Circumference --      Peak Flow --      Pain Score 04/19/22 0910 8     Pain Loc --      Pain Edu? --      Excl. in GC? --    No data found.  Updated Vital Signs BP 120/78   Pulse 69   Temp 98.4 F (36.9 C)   Resp 16   SpO2 97%   Visual Acuity Right Eye Distance:   Left Eye Distance:   Bilateral Distance:    Right Eye Near:   Left Eye Near:    Bilateral Near:     Physical Exam Vitals reviewed.  Constitutional:      General: He is not in acute distress.    Appearance: Normal appearance. He is not ill-appearing.  HENT:     Head: Normocephalic and atraumatic.  Pulmonary:     Effort: Pulmonary effort is normal.  Musculoskeletal:     Comments: L ankle - no skin changes or swelling. Minimally TTP over the lateral malleolus, and no midfoot tenderness. Pain ATF ligament  elicited with dorsiflexion. Gait intact but with pain. DP 2+ cap refill <2 seconds.   Neurological:     General: No focal deficit present.     Mental Status: He is alert and oriented to person, place, and time.  Psychiatric:        Mood and Affect: Mood normal.        Behavior: Behavior normal.        Thought Content: Thought content normal.        Judgment: Judgment normal.      UC Treatments / Results  Labs (all labs ordered are listed, but only abnormal results are displayed) Labs Reviewed - No data to display  EKG   Radiology DG Ankle Complete Left  Result Date: 04/19/2022 CLINICAL DATA:  Left ankle pain for 1 week.  No known trauma. EXAM: LEFT ANKLE COMPLETE - 3+ VIEW COMPARISON:  None Available. FINDINGS: There is no evidence of fracture, dislocation, or joint effusion. There is no evidence of arthropathy or other focal bone abnormality. Soft tissues are unremarkable. IMPRESSION: Negative. Electronically Signed   By: Kerby Moors M.D.   On: 04/19/2022 09:29    Procedures Procedures (including critical care time)  Medications Ordered in UC Medications - No data to display  Initial  Impression / Assessment and Plan / UC Course  I have reviewed the triage vital signs and the nursing notes.  Pertinent labs & imaging results that were available during my care of the patient were reviewed by me and considered in my medical decision making (see chart for details).     This patient is a very pleasant 26 y.o. year old male presenting with L ankle sprain. Neurovascularly intact.   Xray L ankle - negative.   ASO brace, RICE. Work note provided.   ED return precautions discussed. Patient verbalizes understanding and agreement.    Final Clinical Impressions(s) / UC Diagnoses   Final diagnoses:  Sprain of anterior talofibular ligament of left ankle, initial encounter     Discharge Instructions      -Ankle brace while pain persists  -You can take Tylenol up to 1000 mg 3 times daily, and ibuprofen up to 600 mg 3 times daily with food.  You can take these together, or alternate every 3-4 hours. -Rest, ice, elevation    ED Prescriptions   None    PDMP not reviewed this encounter.   Hazel Sams, PA-C 04/19/22 [phone]

## 2022-04-19 NOTE — ED Triage Notes (Signed)
Pt c/o left ankle pain for a week. Denies any injury. Reports old injury to that ankle.

## 2022-05-03 ENCOUNTER — Ambulatory Visit (HOSPITAL_COMMUNITY)
Admission: EM | Admit: 2022-05-03 | Discharge: 2022-05-03 | Discharge status: Against Medical Advice | Payer: Medicaid Other

## 2022-05-03 NOTE — ED Notes (Signed)
Per Harrie Foreman (Patient Access), pt stated he was leaving.

## 2022-05-21 ENCOUNTER — Emergency Department (HOSPITAL_BASED_OUTPATIENT_CLINIC_OR_DEPARTMENT_OTHER): Payer: Self-pay | Admitting: Radiology

## 2022-05-21 ENCOUNTER — Emergency Department (HOSPITAL_BASED_OUTPATIENT_CLINIC_OR_DEPARTMENT_OTHER)
Admission: EM | Admit: 2022-05-21 | Discharge: 2022-05-21 | Disposition: A | Discharge status: Home / Self Care | Payer: Self-pay | Attending: Emergency Medicine | Admitting: Emergency Medicine

## 2022-05-21 ENCOUNTER — Encounter (HOSPITAL_BASED_OUTPATIENT_CLINIC_OR_DEPARTMENT_OTHER): Payer: Self-pay

## 2022-05-21 ENCOUNTER — Other Ambulatory Visit: Payer: Self-pay

## 2022-05-21 DIAGNOSIS — R0789 Other chest pain: Secondary | ICD-10-CM | POA: Diagnosis not present

## 2022-05-21 DIAGNOSIS — Y92410 Unspecified street and highway as the place of occurrence of the external cause: Secondary | ICD-10-CM | POA: Diagnosis not present

## 2022-05-21 NOTE — Discharge Instructions (Signed)
You will be sore in the coming days.  Please take 600 mg ibuprofen every 6 hours as needed for pain.  Please return to the emerge apartment for any worsening symptoms you might have.
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
## 2022-05-21 NOTE — ED Notes (Signed)
Provider at the Bedside. 
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
## 2022-11-19 ENCOUNTER — Telehealth: Payer: Self-pay

## 2022-11-19 NOTE — Telephone Encounter (Signed)
Mychart msg sent. AS, CMA 

## 2023-04-17 IMAGING — DX DG ANKLE COMPLETE 3+V*L*
3 series · 3 of 3 positions shown · non-contrast
Comparison: None Available.

CLINICAL DATA: Left ankle pain for 1 week.  No known trauma.

EXAM:
LEFT ANKLE COMPLETE - 3+ VIEW

[ankle ap]
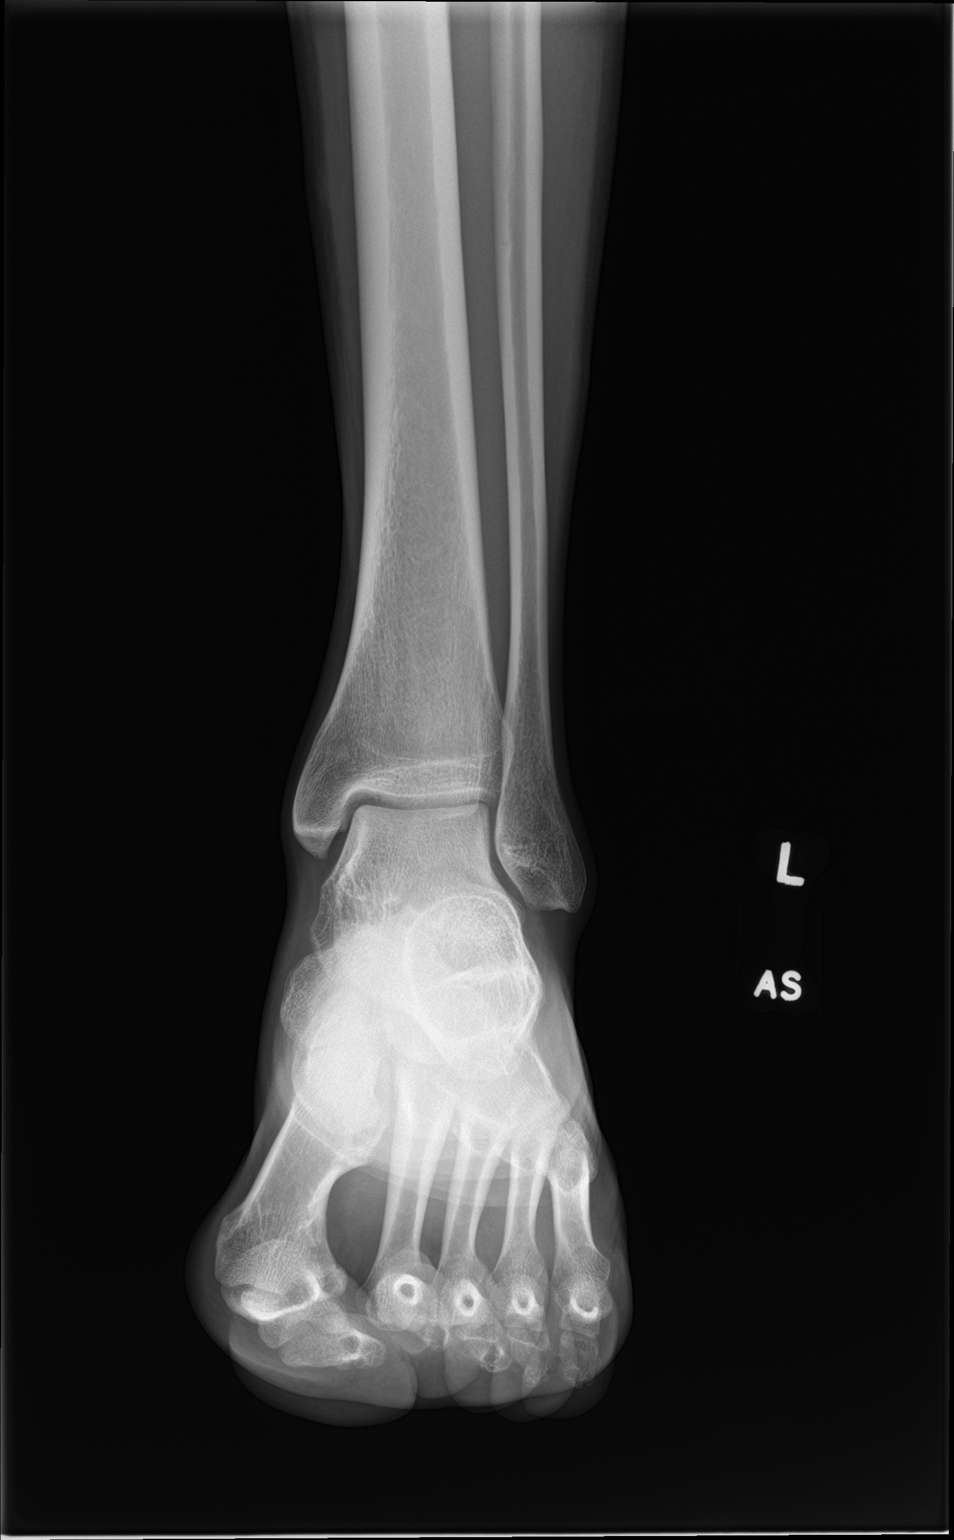

[ankle obl]
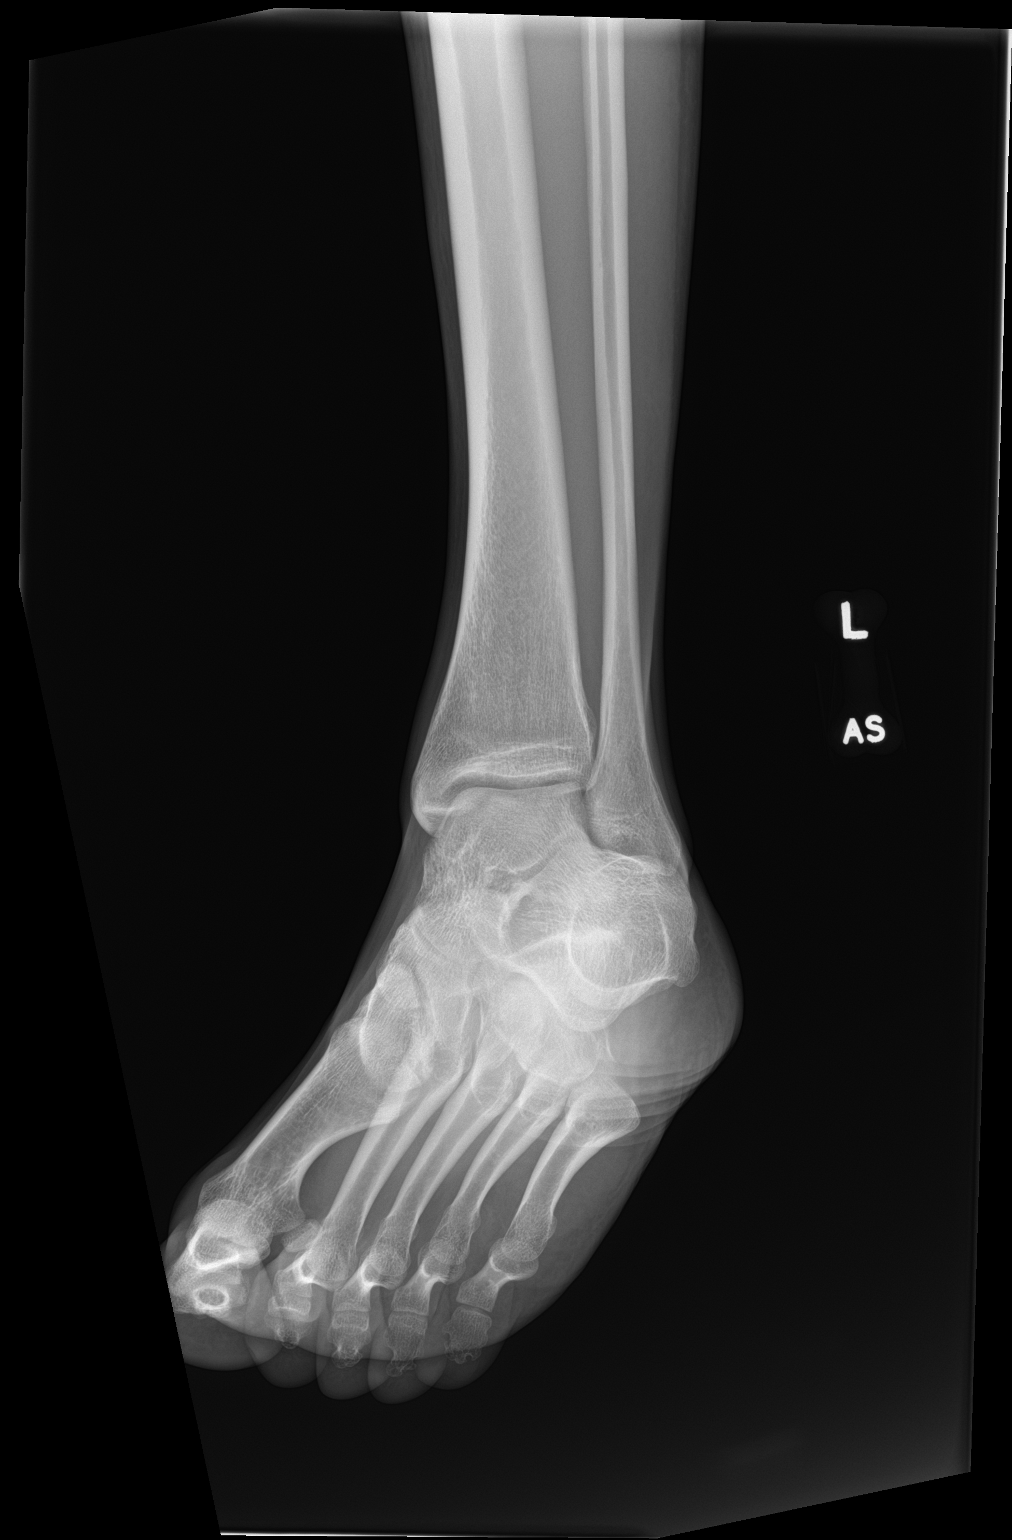

[ankle lat]
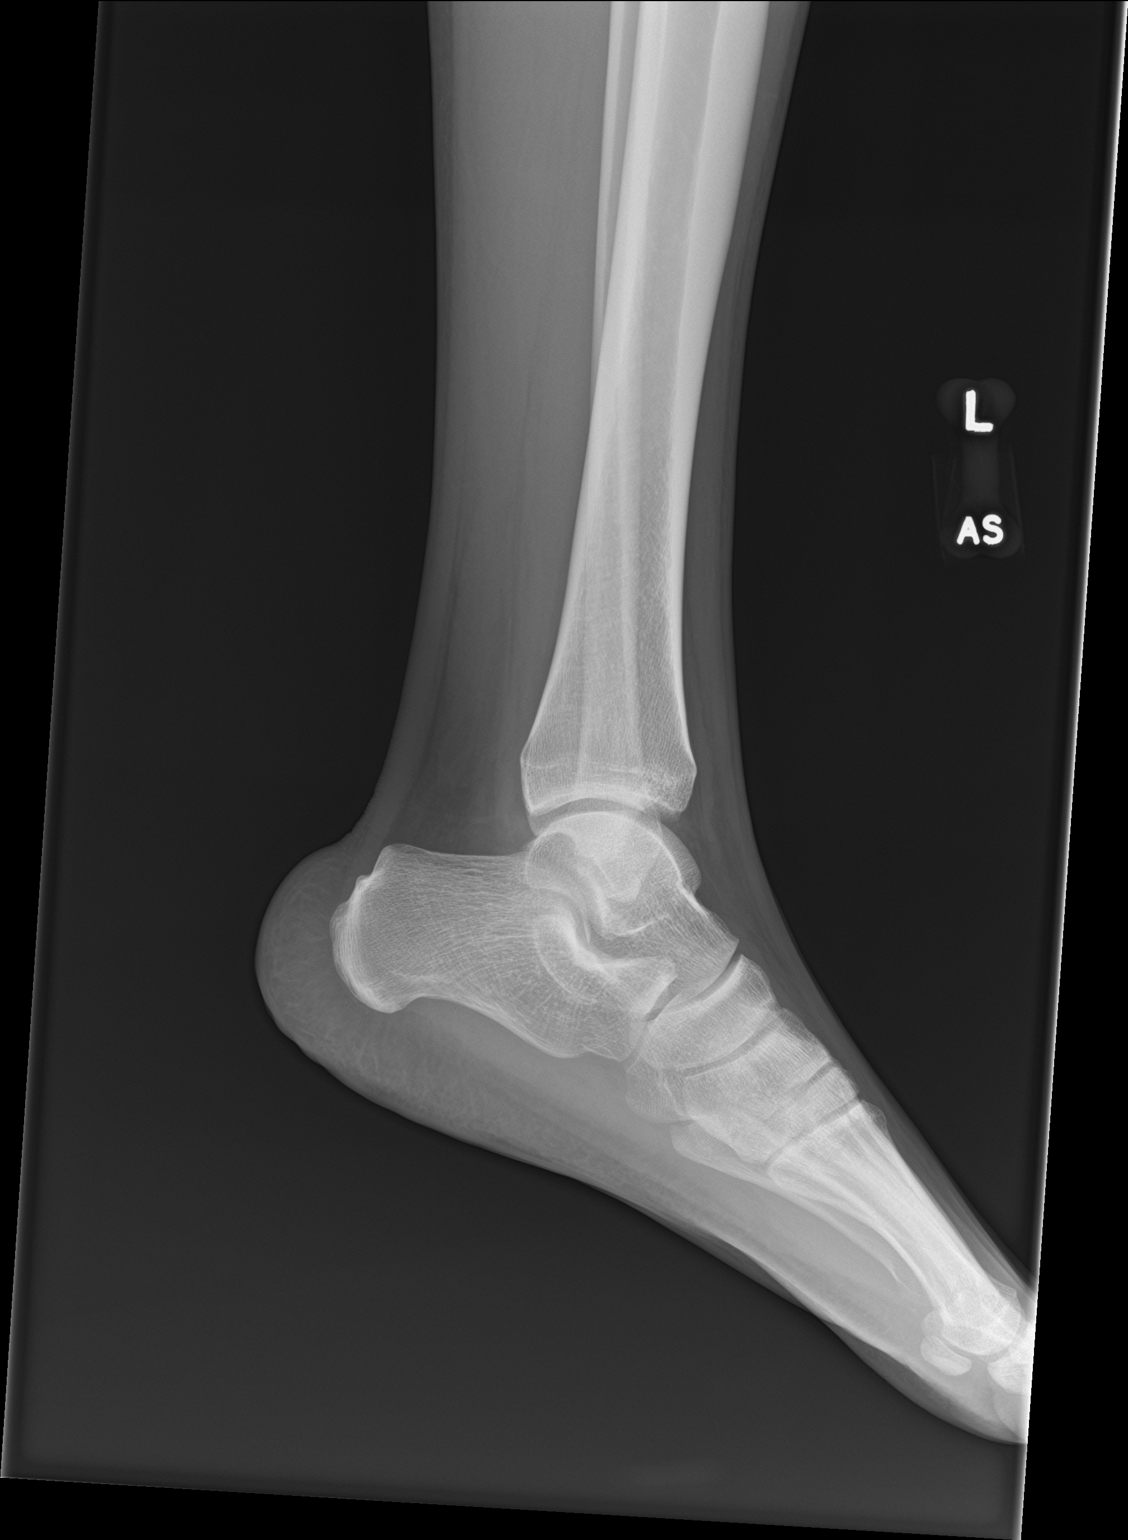

[3 of 3 positions shown; findings below may reference images not displayed]

FINDINGS: There is no evidence of fracture, dislocation, or joint effusion.
There is no evidence of arthropathy or other focal bone abnormality.
Soft tissues are unremarkable.
IMPRESSION: Negative.

## 2023-07-18 DIAGNOSIS — Z114 Encounter for screening for human immunodeficiency virus [HIV]: Secondary | ICD-10-CM | POA: Diagnosis not present

## 2023-07-18 DIAGNOSIS — Z202 Contact with and (suspected) exposure to infections with a predominantly sexual mode of transmission: Secondary | ICD-10-CM | POA: Diagnosis not present

## 2023-07-18 DIAGNOSIS — Z113 Encounter for screening for infections with a predominantly sexual mode of transmission: Secondary | ICD-10-CM | POA: Diagnosis not present

## 2023-07-30 DIAGNOSIS — A5600 Chlamydial infection of lower genitourinary tract, unspecified: Secondary | ICD-10-CM | POA: Diagnosis not present

## 2024-06-23 DIAGNOSIS — L089 Local infection of the skin and subcutaneous tissue, unspecified: Secondary | ICD-10-CM | POA: Diagnosis not present

## 2024-06-23 DIAGNOSIS — B9689 Other specified bacterial agents as the cause of diseases classified elsewhere: Secondary | ICD-10-CM | POA: Diagnosis not present

## 2024-08-31 ENCOUNTER — Other Ambulatory Visit: Payer: Self-pay

## 2024-08-31 ENCOUNTER — Emergency Department (HOSPITAL_BASED_OUTPATIENT_CLINIC_OR_DEPARTMENT_OTHER)
Admission: EM | Admit: 2024-08-31 | Discharge: 2024-08-31 | Disposition: A | Discharge status: Home / Self Care | Attending: Emergency Medicine | Admitting: Emergency Medicine

## 2024-08-31 ENCOUNTER — Emergency Department (HOSPITAL_BASED_OUTPATIENT_CLINIC_OR_DEPARTMENT_OTHER): Admitting: Radiology

## 2024-08-31 DIAGNOSIS — M25562 Pain in left knee: Secondary | ICD-10-CM | POA: Diagnosis not present

## 2024-08-31 DIAGNOSIS — M791 Myalgia, unspecified site: Secondary | ICD-10-CM

## 2024-08-31 DIAGNOSIS — Y92410 Unspecified street and highway as the place of occurrence of the external cause: Secondary | ICD-10-CM | POA: Insufficient documentation

## 2024-08-31 DIAGNOSIS — M542 Cervicalgia: Secondary | ICD-10-CM | POA: Diagnosis not present

## 2024-08-31 DIAGNOSIS — R0789 Other chest pain: Secondary | ICD-10-CM | POA: Diagnosis not present

## 2024-08-31 DIAGNOSIS — M25561 Pain in right knee: Secondary | ICD-10-CM | POA: Diagnosis not present

## 2024-08-31 DIAGNOSIS — T148XXA Other injury of unspecified body region, initial encounter: Secondary | ICD-10-CM | POA: Insufficient documentation

## 2024-08-31 MED ORDER — METHOCARBAMOL 500 MG PO TABS: 500.0000 mg | ORAL_TABLET | Freq: Two times a day (BID) | ORAL | 0 refills | Status: DC
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
## 2024-08-31 NOTE — Discharge Instructions (Addendum)
 Xray and exam are reassuring. Please take Ibuprofen  as need. I have prescribed a muscle relaxer as needed for muscle spasms. If you have any worsening symptoms please return to ED for further evaluation.

## 2024-09-11 ENCOUNTER — Emergency Department (HOSPITAL_BASED_OUTPATIENT_CLINIC_OR_DEPARTMENT_OTHER)
Admission: EM | Admit: 2024-09-11 | Discharge: 2024-09-11 | Disposition: A | Discharge status: Home / Self Care | Attending: Emergency Medicine | Admitting: Emergency Medicine

## 2024-09-11 ENCOUNTER — Encounter (HOSPITAL_BASED_OUTPATIENT_CLINIC_OR_DEPARTMENT_OTHER): Payer: Self-pay

## 2024-09-11 ENCOUNTER — Other Ambulatory Visit: Payer: Self-pay

## 2024-09-11 DIAGNOSIS — M542 Cervicalgia: Secondary | ICD-10-CM | POA: Diagnosis not present

## 2024-09-11 DIAGNOSIS — S0990XA Unspecified injury of head, initial encounter: Secondary | ICD-10-CM | POA: Diagnosis not present

## 2024-09-11 DIAGNOSIS — Y92410 Unspecified street and highway as the place of occurrence of the external cause: Secondary | ICD-10-CM | POA: Insufficient documentation

## 2024-09-11 DIAGNOSIS — R519 Headache, unspecified: Secondary | ICD-10-CM | POA: Diagnosis not present

## 2024-09-11 MED ORDER — LIDOCAINE 5 % EX PTCH
1.0000 | MEDICATED_PATCH | CUTANEOUS | Status: DC
  Administered 2024-09-11: 1 via TRANSDERMAL
  Filled 2024-09-11: qty 1

## 2024-09-11 MED ORDER — LIDOCAINE 5 % EX PTCH: 1.0000 | MEDICATED_PATCH | CUTANEOUS | 0 refills | Status: AC

## 2024-09-11 MED ORDER — IBUPROFEN 800 MG PO TABS: 800.0000 mg | ORAL_TABLET | Freq: Three times a day (TID) | ORAL | 0 refills | Status: AC | PRN

## 2024-09-11 MED ORDER — IBUPROFEN 800 MG PO TABS
800.0000 mg | ORAL_TABLET | Freq: Once | ORAL | Status: AC
  Administered 2024-09-11: 800 mg via ORAL
  Filled 2024-09-11: qty 1

## 2024-09-11 MED ORDER — CYCLOBENZAPRINE HCL 5 MG PO TABS: 5.0000 mg | ORAL_TABLET | Freq: Two times a day (BID) | ORAL | 0 refills | Status: AC | PRN
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
